# Patient Record
Sex: Female | Born: 1980 | Race: White | Hispanic: No | Marital: Married | Smoking: Current every day smoker
Health system: Southern US, Community
[De-identification: ages and names within clinical notes are randomized; demographics above are authoritative.]

## PROBLEM LIST (undated history)

## (undated) DIAGNOSIS — F319 Bipolar disorder, unspecified: Secondary | ICD-10-CM

## (undated) DIAGNOSIS — T7840XA Allergy, unspecified, initial encounter: Secondary | ICD-10-CM

## (undated) DIAGNOSIS — D3501 Benign neoplasm of right adrenal gland: Secondary | ICD-10-CM

## (undated) DIAGNOSIS — E109 Type 1 diabetes mellitus without complications: Secondary | ICD-10-CM

## (undated) DIAGNOSIS — J449 Chronic obstructive pulmonary disease, unspecified: Secondary | ICD-10-CM

## (undated) DIAGNOSIS — K760 Fatty (change of) liver, not elsewhere classified: Secondary | ICD-10-CM

## (undated) DIAGNOSIS — J45909 Unspecified asthma, uncomplicated: Secondary | ICD-10-CM

## (undated) DIAGNOSIS — K219 Gastro-esophageal reflux disease without esophagitis: Secondary | ICD-10-CM

## (undated) DIAGNOSIS — M1990 Unspecified osteoarthritis, unspecified site: Secondary | ICD-10-CM

## (undated) DIAGNOSIS — F419 Anxiety disorder, unspecified: Secondary | ICD-10-CM

## (undated) DIAGNOSIS — A029 Salmonella infection, unspecified: Secondary | ICD-10-CM

## (undated) HISTORY — DX: Gastro-esophageal reflux disease without esophagitis: K21.9

## (undated) HISTORY — PX: TONSILLECTOMY: SUR1361

## (undated) HISTORY — DX: Unspecified asthma, uncomplicated: J45.909

## (undated) HISTORY — DX: Chronic obstructive pulmonary disease, unspecified: J44.9

## (undated) HISTORY — PX: ABDOMINAL HYSTERECTOMY: SHX81

## (undated) HISTORY — PX: CERVICAL POLYPECTOMY: SHX88

## (undated) HISTORY — DX: Allergy, unspecified, initial encounter: T78.40XA

## (undated) HISTORY — PX: SPINE SURGERY: SHX786

## (undated) HISTORY — DX: Unspecified osteoarthritis, unspecified site: M19.90

## (undated) HISTORY — PX: KNEE ARTHROSCOPY: SUR90

## (undated) HISTORY — PX: BACK SURGERY: SHX140

## (undated) HISTORY — DX: Fatty (change of) liver, not elsewhere classified: K76.0

## (undated) HISTORY — DX: Bipolar disorder, unspecified: F31.9

## (undated) HISTORY — DX: Benign neoplasm of right adrenal gland: D35.01

## (undated) HISTORY — DX: Salmonella infection, unspecified: A02.9

## (undated) HISTORY — DX: Anxiety disorder, unspecified: F41.9

## (undated) HISTORY — DX: Type 1 diabetes mellitus without complications: E10.9

---
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
## 1998-07-24 ENCOUNTER — Inpatient Hospital Stay (HOSPITAL_COMMUNITY): Admission: EM | Admit: 1998-07-24 | Discharge: 1998-07-25 | Payer: Self-pay | Admitting: Family Medicine

## 1998-07-24 ENCOUNTER — Encounter: Payer: Self-pay | Admitting: Family Medicine

## 1998-07-31 ENCOUNTER — Inpatient Hospital Stay (HOSPITAL_COMMUNITY): Admission: EM | Admit: 1998-07-31 | Discharge: 1998-08-03 | Payer: Self-pay

## 1998-09-11 ENCOUNTER — Encounter: Admission: RE | Admit: 1998-09-11 | Discharge: 1998-12-10 | Payer: Self-pay | Admitting: *Deleted

## 1999-02-17 ENCOUNTER — Emergency Department (HOSPITAL_COMMUNITY): Admission: EM | Admit: 1999-02-17 | Discharge: 1999-02-17 | Payer: Self-pay | Admitting: Emergency Medicine

## 1999-03-26 ENCOUNTER — Ambulatory Visit (HOSPITAL_COMMUNITY): Admission: RE | Admit: 1999-03-26 | Discharge: 1999-03-26 | Payer: Self-pay | Admitting: Obstetrics and Gynecology

## 1999-09-11 ENCOUNTER — Emergency Department (HOSPITAL_COMMUNITY): Admission: EM | Admit: 1999-09-11 | Discharge: 1999-09-11 | Payer: Self-pay | Admitting: Emergency Medicine

## 1999-09-11 ENCOUNTER — Encounter: Payer: Self-pay | Admitting: Emergency Medicine

## 1999-09-13 ENCOUNTER — Inpatient Hospital Stay (HOSPITAL_COMMUNITY): Admission: EM | Admit: 1999-09-13 | Discharge: 1999-09-15 | Payer: Self-pay | Admitting: Specialist

## 1999-09-13 ENCOUNTER — Encounter: Payer: Self-pay | Admitting: Specialist

## 1999-09-14 ENCOUNTER — Encounter: Payer: Self-pay | Admitting: Specialist

## 1999-10-24 ENCOUNTER — Other Ambulatory Visit: Admission: RE | Admit: 1999-10-24 | Discharge: 1999-10-24 | Payer: Self-pay | Admitting: Gynecology

## 1999-10-26 ENCOUNTER — Observation Stay (HOSPITAL_COMMUNITY): Admission: EM | Admit: 1999-10-26 | Discharge: 1999-10-27 | Payer: Self-pay | Admitting: Internal Medicine

## 1999-10-26 ENCOUNTER — Encounter: Payer: Self-pay | Admitting: Internal Medicine

## 2000-03-09 ENCOUNTER — Encounter: Payer: Self-pay | Admitting: Specialist

## 2000-03-09 ENCOUNTER — Observation Stay (HOSPITAL_COMMUNITY): Admission: RE | Admit: 2000-03-09 | Discharge: 2000-03-10 | Payer: Self-pay | Admitting: Specialist

## 2000-03-09 ENCOUNTER — Encounter (INDEPENDENT_AMBULATORY_CARE_PROVIDER_SITE_OTHER): Payer: Self-pay

## 2000-03-23 ENCOUNTER — Encounter: Admission: RE | Admit: 2000-03-23 | Discharge: 2000-04-22 | Payer: Self-pay | Admitting: Specialist

## 2000-08-10 ENCOUNTER — Inpatient Hospital Stay (HOSPITAL_COMMUNITY): Admission: EM | Admit: 2000-08-10 | Discharge: 2000-08-21 | Payer: Self-pay | Admitting: Specialist

## 2000-08-10 ENCOUNTER — Encounter (INDEPENDENT_AMBULATORY_CARE_PROVIDER_SITE_OTHER): Payer: Self-pay | Admitting: Specialist

## 2000-08-11 ENCOUNTER — Encounter: Payer: Self-pay | Admitting: Obstetrics and Gynecology

## 2000-08-12 ENCOUNTER — Encounter: Payer: Self-pay | Admitting: Specialist

## 2000-09-17 ENCOUNTER — Encounter: Admission: RE | Admit: 2000-09-17 | Discharge: 2000-09-17 | Payer: Self-pay | Admitting: Family Medicine

## 2000-09-18 ENCOUNTER — Encounter (HOSPITAL_COMMUNITY): Admission: RE | Admit: 2000-09-18 | Discharge: 2000-10-18 | Payer: Self-pay | Admitting: *Deleted

## 2000-09-23 ENCOUNTER — Encounter: Admission: RE | Admit: 2000-09-23 | Discharge: 2000-09-23 | Payer: Self-pay | Admitting: Obstetrics & Gynecology

## 2000-09-28 ENCOUNTER — Inpatient Hospital Stay (HOSPITAL_COMMUNITY): Admission: AD | Admit: 2000-09-28 | Discharge: 2000-09-28 | Payer: Self-pay | Admitting: Obstetrics

## 2000-09-30 ENCOUNTER — Encounter: Admission: RE | Admit: 2000-09-30 | Discharge: 2000-09-30 | Payer: Self-pay | Admitting: Obstetrics & Gynecology

## 2000-10-01 ENCOUNTER — Ambulatory Visit (HOSPITAL_COMMUNITY): Admission: AD | Admit: 2000-10-01 | Discharge: 2000-10-01 | Payer: Self-pay | Admitting: Obstetrics

## 2000-10-03 ENCOUNTER — Inpatient Hospital Stay (HOSPITAL_COMMUNITY): Admission: AD | Admit: 2000-10-03 | Discharge: 2000-10-03 | Payer: Self-pay | Admitting: Obstetrics

## 2000-10-04 ENCOUNTER — Inpatient Hospital Stay (HOSPITAL_COMMUNITY): Admission: AD | Admit: 2000-10-04 | Discharge: 2000-10-04 | Payer: Self-pay | Admitting: *Deleted

## 2000-10-14 ENCOUNTER — Encounter: Admission: RE | Admit: 2000-10-14 | Discharge: 2000-10-14 | Payer: Self-pay | Admitting: Obstetrics & Gynecology

## 2000-10-27 ENCOUNTER — Encounter (HOSPITAL_COMMUNITY): Admission: RE | Admit: 2000-10-27 | Discharge: 2000-11-26 | Payer: Self-pay | Admitting: *Deleted

## 2000-11-03 ENCOUNTER — Inpatient Hospital Stay (HOSPITAL_COMMUNITY): Admission: AD | Admit: 2000-11-03 | Discharge: 2000-11-03 | Payer: Self-pay | Admitting: *Deleted

## 2000-11-06 ENCOUNTER — Encounter: Payer: Self-pay | Admitting: *Deleted

## 2000-11-17 ENCOUNTER — Encounter: Payer: Self-pay | Admitting: *Deleted

## 2000-11-28 ENCOUNTER — Inpatient Hospital Stay (HOSPITAL_COMMUNITY): Admission: AD | Admit: 2000-11-28 | Discharge: 2000-12-01 | Payer: Self-pay | Admitting: *Deleted

## 2000-11-29 ENCOUNTER — Encounter: Payer: Self-pay | Admitting: *Deleted

## 2000-12-01 ENCOUNTER — Inpatient Hospital Stay (HOSPITAL_COMMUNITY): Admission: AD | Admit: 2000-12-01 | Discharge: 2000-12-01 | Payer: Self-pay | Admitting: *Deleted

## 2000-12-08 ENCOUNTER — Encounter (HOSPITAL_COMMUNITY): Admission: RE | Admit: 2000-12-08 | Discharge: 2001-01-07 | Payer: Self-pay | Admitting: *Deleted

## 2000-12-15 ENCOUNTER — Encounter: Payer: Self-pay | Admitting: *Deleted

## 2000-12-18 ENCOUNTER — Encounter: Payer: Self-pay | Admitting: *Deleted

## 2000-12-22 ENCOUNTER — Encounter: Payer: Self-pay | Admitting: *Deleted

## 2000-12-25 ENCOUNTER — Encounter: Payer: Self-pay | Admitting: *Deleted

## 2000-12-25 ENCOUNTER — Inpatient Hospital Stay (HOSPITAL_COMMUNITY): Admission: AD | Admit: 2000-12-25 | Discharge: 2001-01-01 | Payer: Self-pay | Admitting: Obstetrics & Gynecology

## 2000-12-27 ENCOUNTER — Encounter: Payer: Self-pay | Admitting: Obstetrics & Gynecology

## 2000-12-31 ENCOUNTER — Encounter: Payer: Self-pay | Admitting: *Deleted

## 2001-01-05 ENCOUNTER — Encounter: Payer: Self-pay | Admitting: *Deleted

## 2001-01-08 ENCOUNTER — Encounter (HOSPITAL_COMMUNITY): Admission: RE | Admit: 2001-01-08 | Discharge: 2001-01-12 | Payer: Self-pay | Admitting: Obstetrics & Gynecology

## 2001-01-13 ENCOUNTER — Encounter (INDEPENDENT_AMBULATORY_CARE_PROVIDER_SITE_OTHER): Payer: Self-pay | Admitting: Specialist

## 2001-01-13 ENCOUNTER — Inpatient Hospital Stay (HOSPITAL_COMMUNITY): Admission: AD | Admit: 2001-01-13 | Discharge: 2001-01-19 | Payer: Self-pay | Admitting: Obstetrics

## 2001-01-23 ENCOUNTER — Inpatient Hospital Stay (HOSPITAL_COMMUNITY): Admission: AD | Admit: 2001-01-23 | Discharge: 2001-01-23 | Payer: Self-pay | Admitting: Obstetrics

## 2001-03-09 ENCOUNTER — Inpatient Hospital Stay (HOSPITAL_COMMUNITY): Admission: AD | Admit: 2001-03-09 | Discharge: 2001-03-09 | Payer: Self-pay | Admitting: *Deleted

## 2001-03-09 ENCOUNTER — Encounter (INDEPENDENT_AMBULATORY_CARE_PROVIDER_SITE_OTHER): Payer: Self-pay | Admitting: Specialist

## 2001-06-04 ENCOUNTER — Encounter: Payer: Self-pay | Admitting: Specialist

## 2001-06-09 ENCOUNTER — Inpatient Hospital Stay (HOSPITAL_COMMUNITY): Admission: RE | Admit: 2001-06-09 | Discharge: 2001-06-14 | Payer: Self-pay | Admitting: Specialist

## 2001-06-09 ENCOUNTER — Encounter: Payer: Self-pay | Admitting: Specialist

## 2001-08-10 ENCOUNTER — Encounter: Admission: RE | Admit: 2001-08-10 | Discharge: 2001-11-08 | Payer: Self-pay

## 2001-09-10 ENCOUNTER — Encounter: Payer: Self-pay | Admitting: Family Medicine

## 2001-09-10 ENCOUNTER — Encounter: Admission: RE | Admit: 2001-09-10 | Discharge: 2001-09-10 | Payer: Self-pay | Admitting: Family Medicine

## 2001-11-10 ENCOUNTER — Other Ambulatory Visit: Admission: RE | Admit: 2001-11-10 | Discharge: 2001-11-10 | Payer: Self-pay | Admitting: Obstetrics and Gynecology

## 2002-10-14 ENCOUNTER — Encounter: Payer: Self-pay | Admitting: Family Medicine

## 2002-10-14 ENCOUNTER — Encounter: Admission: RE | Admit: 2002-10-14 | Discharge: 2002-10-14 | Payer: Self-pay | Admitting: Family Medicine

## 2002-12-15 ENCOUNTER — Emergency Department (HOSPITAL_COMMUNITY): Admission: EM | Admit: 2002-12-15 | Discharge: 2002-12-15 | Payer: Self-pay | Admitting: Emergency Medicine

## 2002-12-15 ENCOUNTER — Encounter: Payer: Self-pay | Admitting: Specialist

## 2002-12-23 ENCOUNTER — Emergency Department (HOSPITAL_COMMUNITY): Admission: EM | Admit: 2002-12-23 | Discharge: 2002-12-23 | Payer: Self-pay | Admitting: Emergency Medicine

## 2002-12-23 ENCOUNTER — Encounter: Payer: Self-pay | Admitting: Emergency Medicine

## 2002-12-28 ENCOUNTER — Other Ambulatory Visit: Admission: RE | Admit: 2002-12-28 | Discharge: 2002-12-28 | Payer: Self-pay | Admitting: Obstetrics and Gynecology

## 2003-04-27 ENCOUNTER — Ambulatory Visit (HOSPITAL_COMMUNITY): Admission: RE | Admit: 2003-04-27 | Discharge: 2003-04-27 | Payer: Self-pay | Admitting: *Deleted

## 2003-09-08 ENCOUNTER — Emergency Department (HOSPITAL_COMMUNITY): Admission: EM | Admit: 2003-09-08 | Discharge: 2003-09-09 | Payer: Self-pay | Admitting: Emergency Medicine

## 2004-03-04 ENCOUNTER — Ambulatory Visit: Payer: Self-pay | Admitting: Endocrinology

## 2004-03-14 ENCOUNTER — Ambulatory Visit: Payer: Self-pay | Admitting: Endocrinology

## 2004-03-18 ENCOUNTER — Other Ambulatory Visit: Admission: RE | Admit: 2004-03-18 | Discharge: 2004-03-18 | Payer: Self-pay | Admitting: Obstetrics and Gynecology

## 2004-05-17 ENCOUNTER — Ambulatory Visit: Payer: Self-pay | Admitting: Endocrinology

## 2004-08-06 ENCOUNTER — Ambulatory Visit: Payer: Self-pay | Admitting: Endocrinology

## 2004-09-04 ENCOUNTER — Ambulatory Visit: Payer: Self-pay | Admitting: Endocrinology

## 2004-10-17 ENCOUNTER — Ambulatory Visit: Payer: Self-pay | Admitting: Endocrinology

## 2004-12-02 ENCOUNTER — Ambulatory Visit: Payer: Self-pay | Admitting: Internal Medicine

## 2004-12-11 ENCOUNTER — Ambulatory Visit: Payer: Self-pay | Admitting: Endocrinology

## 2004-12-16 ENCOUNTER — Ambulatory Visit: Payer: Self-pay | Admitting: Internal Medicine

## 2005-01-08 ENCOUNTER — Ambulatory Visit: Payer: Self-pay | Admitting: Endocrinology

## 2005-01-20 ENCOUNTER — Ambulatory Visit: Payer: Self-pay | Admitting: Endocrinology

## 2005-01-22 ENCOUNTER — Ambulatory Visit: Payer: Self-pay | Admitting: Endocrinology

## 2005-03-18 ENCOUNTER — Other Ambulatory Visit: Admission: RE | Admit: 2005-03-18 | Discharge: 2005-03-18 | Payer: Self-pay | Admitting: Obstetrics and Gynecology

## 2005-04-10 ENCOUNTER — Ambulatory Visit: Payer: Self-pay | Admitting: Internal Medicine

## 2005-04-16 ENCOUNTER — Ambulatory Visit: Payer: Self-pay | Admitting: Internal Medicine

## 2005-05-08 ENCOUNTER — Ambulatory Visit: Payer: Self-pay | Admitting: Endocrinology

## 2005-05-12 ENCOUNTER — Ambulatory Visit: Payer: Self-pay | Admitting: Endocrinology

## 2005-05-16 ENCOUNTER — Ambulatory Visit: Payer: Self-pay | Admitting: Endocrinology

## 2005-05-20 ENCOUNTER — Ambulatory Visit: Payer: Self-pay | Admitting: *Deleted

## 2005-05-22 ENCOUNTER — Ambulatory Visit: Payer: Self-pay | Admitting: Endocrinology

## 2005-06-25 ENCOUNTER — Ambulatory Visit: Payer: Self-pay | Admitting: Internal Medicine

## 2005-07-24 ENCOUNTER — Ambulatory Visit: Payer: Self-pay | Admitting: Endocrinology

## 2005-07-24 ENCOUNTER — Ambulatory Visit (HOSPITAL_COMMUNITY): Admission: RE | Admit: 2005-07-24 | Discharge: 2005-07-24 | Payer: Self-pay | Admitting: Internal Medicine

## 2005-08-06 ENCOUNTER — Ambulatory Visit: Payer: Self-pay | Admitting: Endocrinology

## 2005-10-24 ENCOUNTER — Emergency Department (HOSPITAL_COMMUNITY): Admission: EM | Admit: 2005-10-24 | Discharge: 2005-10-25 | Payer: Self-pay | Admitting: Emergency Medicine

## 2005-10-27 ENCOUNTER — Ambulatory Visit: Payer: Self-pay | Admitting: Endocrinology

## 2005-12-08 ENCOUNTER — Ambulatory Visit: Payer: Self-pay | Admitting: Internal Medicine

## 2005-12-10 ENCOUNTER — Ambulatory Visit: Payer: Self-pay | Admitting: Gastroenterology

## 2005-12-11 ENCOUNTER — Ambulatory Visit (HOSPITAL_COMMUNITY): Admission: RE | Admit: 2005-12-11 | Discharge: 2005-12-11 | Payer: Self-pay | Admitting: Gastroenterology

## 2005-12-11 ENCOUNTER — Encounter (INDEPENDENT_AMBULATORY_CARE_PROVIDER_SITE_OTHER): Payer: Self-pay | Admitting: Specialist

## 2005-12-11 ENCOUNTER — Encounter: Payer: Self-pay | Admitting: Gastroenterology

## 2005-12-17 ENCOUNTER — Ambulatory Visit: Payer: Self-pay | Admitting: Gastroenterology

## 2006-02-02 ENCOUNTER — Ambulatory Visit: Payer: Self-pay | Admitting: Endocrinology

## 2006-03-24 ENCOUNTER — Other Ambulatory Visit: Admission: RE | Admit: 2006-03-24 | Discharge: 2006-03-24 | Payer: Self-pay | Admitting: Obstetrics and Gynecology

## 2006-04-01 ENCOUNTER — Ambulatory Visit: Payer: Self-pay | Admitting: Internal Medicine

## 2006-04-01 ENCOUNTER — Ambulatory Visit: Payer: Self-pay | Admitting: Endocrinology

## 2006-04-01 LAB — CONVERTED CEMR LAB
ALT: 11 units/L (ref 0–40)
AST: 15 units/L (ref 0–37)
Albumin: 3.7 g/dL (ref 3.5–5.2)
Alkaline Phosphatase: 71 units/L (ref 39–117)
BUN: 11 mg/dL (ref 6–23)
Basophils Absolute: 0 10*3/uL (ref 0.0–0.1)
Basophils Relative: 0.2 % (ref 0.0–1.0)
CO2: 30 meq/L (ref 19–32)
Calcium: 9.2 mg/dL (ref 8.4–10.5)
Chloride: 98 meq/L (ref 96–112)
Chol/HDL Ratio, serum: 5
Cholesterol: 213 mg/dL (ref 0–200)
Creatinine, Ser: 0.8 mg/dL (ref 0.4–1.2)
Eosinophil percent: 1.2 % (ref 0.0–5.0)
GFR calc non Af Amer: 91 mL/min
Glomerular Filtration Rate, Af Am: 111 mL/min/{1.73_m2}
Glucose, Bld: 245 mg/dL — ABNORMAL HIGH (ref 70–99)
HCT: 43.5 % (ref 36.0–46.0)
HDL: 42.3 mg/dL (ref >39.0)
Hemoglobin: 14.5 g/dL (ref 12.0–15.0)
Hgb A1c MFr Bld: 8.7 % — ABNORMAL HIGH (ref 4.6–6.0)
LDL DIRECT: 135.9 mg/dL
Lymphocytes Relative: 43.4 % (ref 12.0–46.0)
MCHC: 33.3 g/dL (ref 30.0–36.0)
MCV: 87.8 fL (ref 78.0–100.0)
Monocytes Absolute: 0.5 10*3/uL (ref 0.2–0.7)
Monocytes Relative: 6.1 % (ref 3.0–11.0)
Neutro Abs: 4.4 10*3/uL (ref 1.4–7.7)
Neutrophils Relative %: 49.1 % (ref 43.0–77.0)
Platelets: 373 10*3/uL (ref 150–400)
Potassium: 3.9 meq/L (ref 3.5–5.1)
RBC: 4.95 M/uL (ref 3.87–5.11)
RDW: 12.7 % (ref 11.5–14.6)
Sodium: 137 meq/L (ref 135–145)
Total Bilirubin: 0.4 mg/dL (ref 0.3–1.2)
Total Protein: 6.3 g/dL (ref 6.0–8.3)
Triglyceride fasting, serum: 375 mg/dL (ref 0–149)
VLDL: 75 mg/dL — ABNORMAL HIGH (ref 0–40)
WBC: 8.8 10*3/uL (ref 4.5–10.5)

## 2006-04-17 ENCOUNTER — Encounter: Admission: RE | Admit: 2006-04-17 | Discharge: 2006-04-17 | Payer: Self-pay | Admitting: Internal Medicine

## 2006-04-17 ENCOUNTER — Ambulatory Visit: Payer: Self-pay | Admitting: Internal Medicine

## 2006-05-05 ENCOUNTER — Ambulatory Visit: Payer: Self-pay | Admitting: Endocrinology

## 2006-05-25 ENCOUNTER — Ambulatory Visit: Payer: Self-pay | Admitting: Internal Medicine

## 2006-06-30 ENCOUNTER — Other Ambulatory Visit: Admission: RE | Admit: 2006-06-30 | Discharge: 2006-06-30 | Payer: Self-pay | Admitting: Obstetrics and Gynecology

## 2006-07-28 LAB — CONVERTED CEMR LAB

## 2006-09-25 ENCOUNTER — Encounter: Payer: Self-pay | Admitting: Endocrinology

## 2006-09-25 DIAGNOSIS — F329 Major depressive disorder, single episode, unspecified: Secondary | ICD-10-CM | POA: Insufficient documentation

## 2006-09-25 DIAGNOSIS — K56609 Unspecified intestinal obstruction, unspecified as to partial versus complete obstruction: Secondary | ICD-10-CM | POA: Insufficient documentation

## 2006-09-25 DIAGNOSIS — E109 Type 1 diabetes mellitus without complications: Secondary | ICD-10-CM | POA: Insufficient documentation

## 2006-10-02 ENCOUNTER — Ambulatory Visit: Payer: Self-pay | Admitting: Gastroenterology

## 2006-10-02 LAB — CONVERTED CEMR LAB
ALT: 18 units/L (ref 0–40)
AST: 29 units/L (ref 0–37)
Albumin: 3.3 g/dL — ABNORMAL LOW (ref 3.5–5.2)
Alkaline Phosphatase: 67 units/L (ref 39–117)
BUN: 6 mg/dL (ref 6–23)
Basophils Absolute: 0.2 10*3/uL — ABNORMAL HIGH (ref 0.0–0.1)
Basophils Relative: 1.8 % — ABNORMAL HIGH (ref 0.0–1.0)
Bilirubin, Direct: 0.3 mg/dL (ref 0.0–0.3)
CO2: 22 meq/L (ref 19–32)
Calcium: 9.3 mg/dL (ref 8.4–10.5)
Chloride: 103 meq/L (ref 96–112)
Creatinine, Ser: 0.6 mg/dL (ref 0.4–1.2)
Eosinophils Absolute: 0.1 10*3/uL (ref 0.0–0.6)
Eosinophils Relative: 0.7 % (ref 0.0–5.0)
GFR calc Af Amer: 154 mL/min
GFR calc non Af Amer: 127 mL/min
Glucose, Bld: 116 mg/dL — ABNORMAL HIGH (ref 70–99)
HCT: 42.6 % (ref 36.0–46.0)
Hemoglobin: 14.3 g/dL (ref 12.0–15.0)
Lymphocytes Relative: 29.2 % (ref 12.0–46.0)
MCHC: 33.6 g/dL (ref 30.0–36.0)
MCV: 85.9 fL (ref 78.0–100.0)
Monocytes Absolute: 0.7 10*3/uL (ref 0.2–0.7)
Monocytes Relative: 6.2 % (ref 3.0–11.0)
Neutro Abs: 6.6 10*3/uL (ref 1.4–7.7)
Neutrophils Relative %: 62.1 % (ref 43.0–77.0)
Platelets: 340 10*3/uL (ref 150–400)
Potassium: 4.8 meq/L (ref 3.5–5.1)
RBC: 4.96 M/uL (ref 3.87–5.11)
RDW: 12.3 % (ref 11.5–14.6)
Sodium: 138 meq/L (ref 135–145)
Total Bilirubin: 0.9 mg/dL (ref 0.3–1.2)
Total Protein: 6.6 g/dL (ref 6.0–8.3)
WBC: 10.8 10*3/uL — ABNORMAL HIGH (ref 4.5–10.5)

## 2006-11-09 ENCOUNTER — Ambulatory Visit: Payer: Self-pay | Admitting: Family Medicine

## 2006-11-09 DIAGNOSIS — F3189 Other bipolar disorder: Secondary | ICD-10-CM | POA: Insufficient documentation

## 2006-11-13 ENCOUNTER — Ambulatory Visit: Payer: Self-pay | Admitting: Family Medicine

## 2006-11-13 DIAGNOSIS — J209 Acute bronchitis, unspecified: Secondary | ICD-10-CM | POA: Insufficient documentation

## 2006-12-25 ENCOUNTER — Ambulatory Visit: Payer: Self-pay | Admitting: Family Medicine

## 2007-01-19 ENCOUNTER — Encounter: Admission: RE | Admit: 2007-01-19 | Discharge: 2007-01-19 | Payer: Self-pay | Admitting: Family Medicine

## 2007-01-19 ENCOUNTER — Ambulatory Visit: Payer: Self-pay | Admitting: Family Medicine

## 2007-01-19 DIAGNOSIS — R079 Chest pain, unspecified: Secondary | ICD-10-CM | POA: Insufficient documentation

## 2007-01-19 LAB — CONVERTED CEMR LAB
Band Neutrophils: 5 % (ref 0–10)
Basophils Absolute: 0 10*3/uL (ref 0.0–0.1)
Basophils Relative: 0 % (ref 0–1)
Beta hcg, urine, semiquantitative: NEGATIVE
Bilirubin Urine: NEGATIVE
Eosinophils Absolute: 0 10*3/uL (ref 0.0–0.7)
Eosinophils Relative: 0 % (ref 0–5)
Glucose, Bld: 164 mg/dL
Glucose, Urine, Semiquant: NEGATIVE
HCT: 46.7 % — ABNORMAL HIGH (ref 36.0–46.0)
Hemoglobin: 16.1 g/dL — ABNORMAL HIGH (ref 12.0–15.0)
Lymphocytes Relative: 13 % (ref 12–46)
Lymphs Abs: 1 10*3/uL (ref 0.7–3.3)
MCHC: 34.4 g/dL (ref 30.0–36.0)
MCV: 87.1 fL (ref 78.0–100.0)
Monocytes Absolute: 0.5 10*3/uL (ref 0.2–0.7)
Monocytes Relative: 6 % (ref 3–11)
Neutro Abs: 6.2 10*3/uL (ref 1.7–7.7)
Neutrophils Relative %: 76 % (ref 43–77)
Nitrite: NEGATIVE
Platelets: 291 10*3/uL (ref 150–400)
Protein, U semiquant: 100
RBC: 5.37 M/uL — ABNORMAL HIGH (ref 3.87–5.11)
RDW: 13.4 % (ref 11.5–14.0)
Specific Gravity, Urine: 1.02
Urobilinogen, UA: 0.2
WBC Urine, dipstick: NEGATIVE
WBC: 7.6 10*3/uL (ref 4.0–10.5)
pH: 8

## 2007-01-20 ENCOUNTER — Telehealth: Payer: Self-pay | Admitting: Family Medicine

## 2007-01-20 ENCOUNTER — Encounter: Payer: Self-pay | Admitting: Family Medicine

## 2007-01-20 LAB — CONVERTED CEMR LAB
ALT: 10 units/L (ref 0–35)
AST: 15 units/L (ref 0–37)
Albumin: 4.4 g/dL (ref 3.5–5.2)
Alkaline Phosphatase: 68 units/L (ref 39–117)
BUN: 11 mg/dL (ref 6–23)
CO2: 23 meq/L (ref 19–32)
Calcium: 9.1 mg/dL (ref 8.4–10.5)
Chloride: 102 meq/L (ref 96–112)
Creatinine, Ser: 0.76 mg/dL (ref 0.40–1.20)
Glucose, Bld: 139 mg/dL — ABNORMAL HIGH (ref 70–99)
Potassium: 4.3 meq/L (ref 3.5–5.3)
Sodium: 137 meq/L (ref 135–145)
Total Bilirubin: 0.3 mg/dL (ref 0.3–1.2)
Total Protein: 6.8 g/dL (ref 6.0–8.3)

## 2007-01-21 LAB — CONVERTED CEMR LAB
Chlamydia, Swab/Urine, PCR: NEGATIVE
GC Probe Amp, Urine: NEGATIVE

## 2007-02-11 ENCOUNTER — Other Ambulatory Visit: Admission: RE | Admit: 2007-02-11 | Discharge: 2007-02-11 | Payer: Self-pay | Admitting: Obstetrics and Gynecology

## 2007-02-15 ENCOUNTER — Encounter (INDEPENDENT_AMBULATORY_CARE_PROVIDER_SITE_OTHER): Payer: Self-pay | Admitting: *Deleted

## 2007-03-01 ENCOUNTER — Ambulatory Visit: Payer: Self-pay | Admitting: Family Medicine

## 2007-03-01 DIAGNOSIS — J45902 Unspecified asthma with status asthmaticus: Secondary | ICD-10-CM | POA: Insufficient documentation

## 2007-03-11 ENCOUNTER — Ambulatory Visit: Payer: Self-pay | Admitting: Family Medicine

## 2007-03-30 ENCOUNTER — Other Ambulatory Visit: Admission: RE | Admit: 2007-03-30 | Discharge: 2007-03-30 | Payer: Self-pay | Admitting: Obstetrics and Gynecology

## 2007-03-31 ENCOUNTER — Encounter: Admission: RE | Admit: 2007-03-31 | Discharge: 2007-03-31 | Payer: Self-pay | Admitting: Family Medicine

## 2007-06-11 ENCOUNTER — Encounter: Payer: Self-pay | Admitting: Endocrinology

## 2007-09-11 DIAGNOSIS — K2990 Gastroduodenitis, unspecified, without bleeding: Secondary | ICD-10-CM

## 2007-09-11 DIAGNOSIS — N83209 Unspecified ovarian cyst, unspecified side: Secondary | ICD-10-CM | POA: Insufficient documentation

## 2007-09-11 DIAGNOSIS — K2970 Gastritis, unspecified, without bleeding: Secondary | ICD-10-CM | POA: Insufficient documentation

## 2007-09-16 ENCOUNTER — Encounter: Payer: Self-pay | Admitting: Endocrinology

## 2007-09-28 ENCOUNTER — Encounter: Payer: Self-pay | Admitting: Family Medicine

## 2008-06-02 IMAGING — CT CT PELVIS W/O CM
2 of 4 series · 17 of 46 positions shown, 19 images · non-contrast
Comparison: None.

CLINICAL DATA: Hematuria with left flank pain.
 ABDOMEN CT WITHOUT CONTRAST ? 10/25/05:
TECHNIQUE: Multidetector CT imaging of the abdomen was performed following the standard protocol without IV contrast.
TECHNIQUE: Multidetector CT imaging of the pelvis was performed following the standard protocol without IV contrast.

[Series 2: stone_wo 5.0 b40f st · axial · 0.61mm/px · z∈[-436,-68]mm · 14 of 101 slices shown, 16 images]
[im 5/101  soft-tissue]
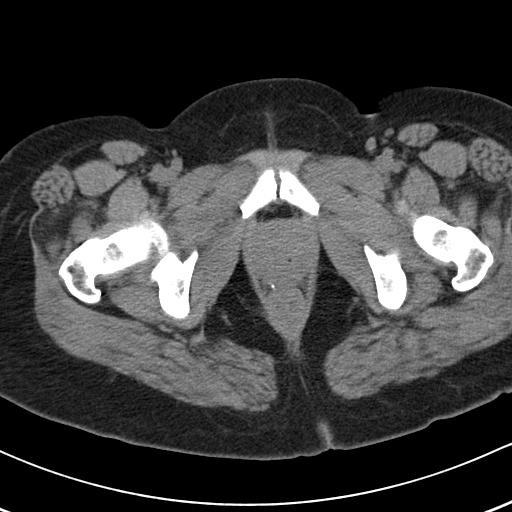
[im 5/101  bone]
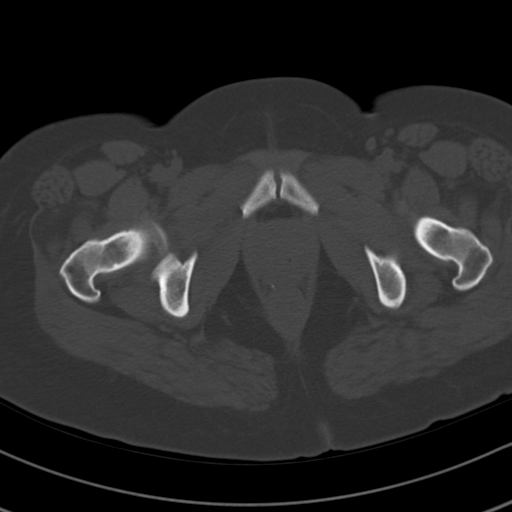
[im 13/101  soft-tissue]
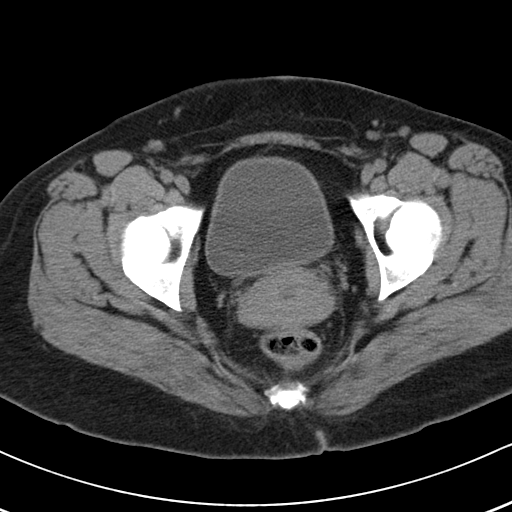
[im 21/101  soft-tissue]
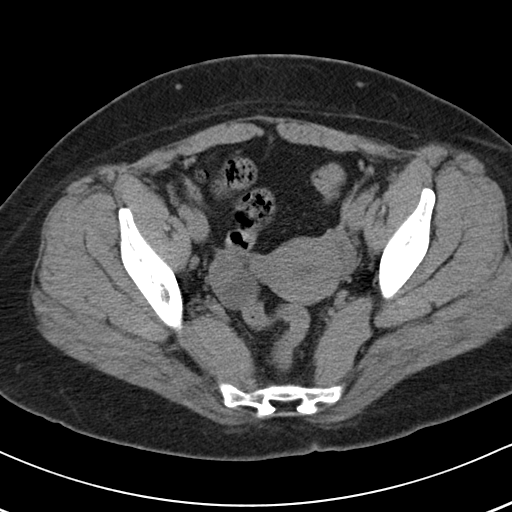
[im 29/101  soft-tissue]
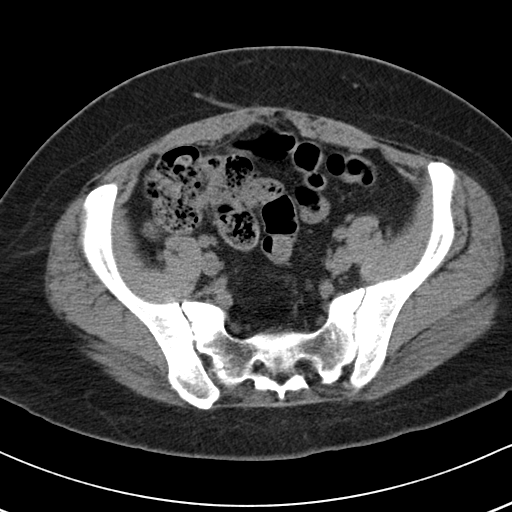
[im 33/101  soft-tissue]
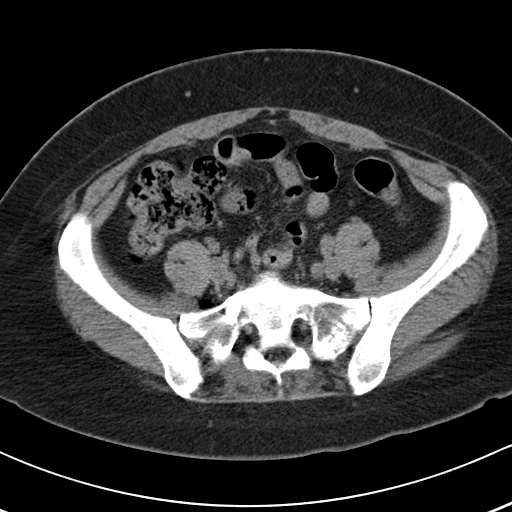
[im 41/101  soft-tissue]
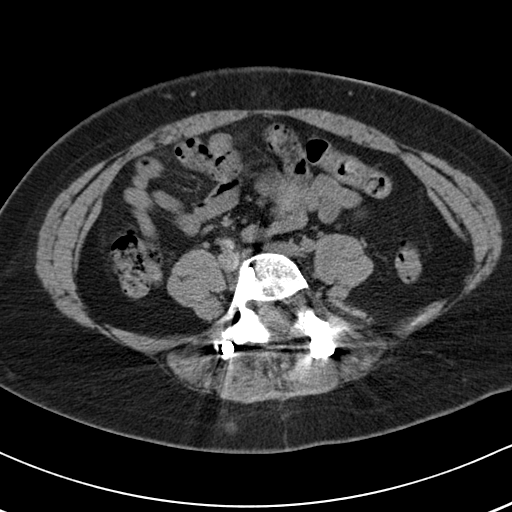
[im 49/101  soft-tissue]
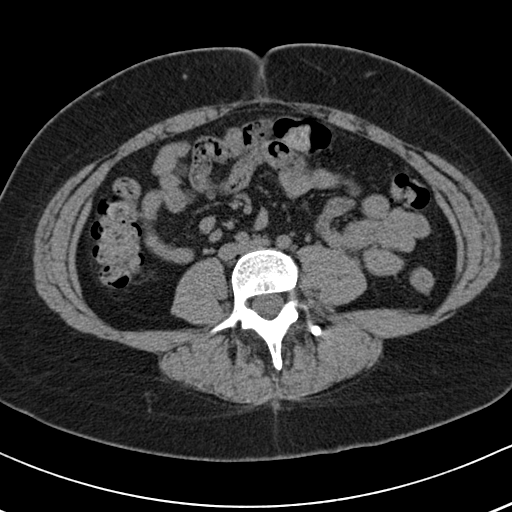
[im 53/101  soft-tissue]
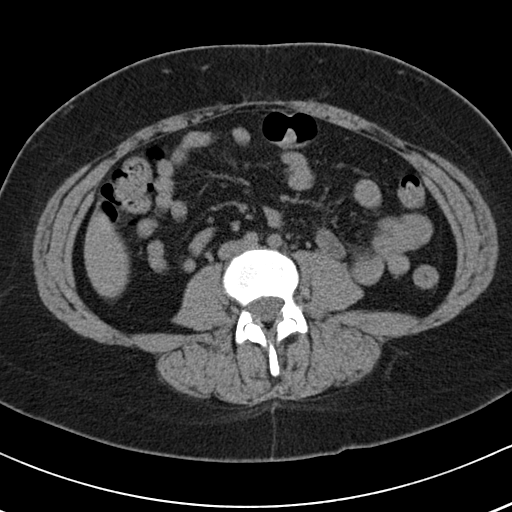
[im 61/101  soft-tissue]
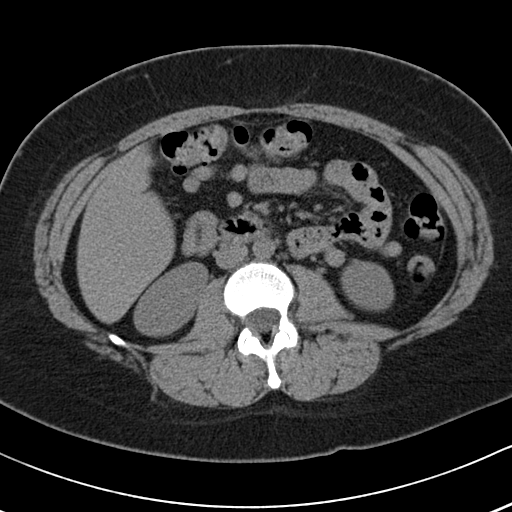
[im 61/101  bone]
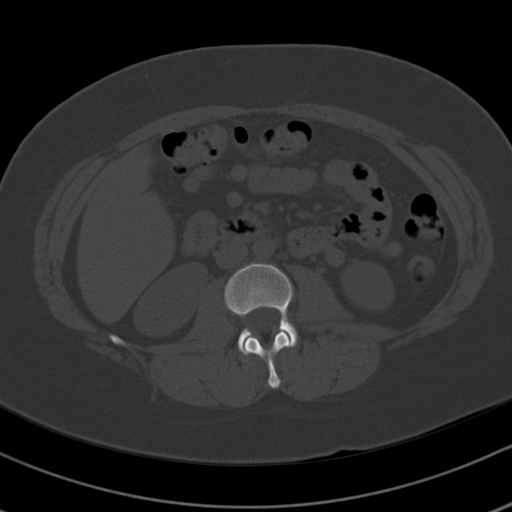
[im 69/101  soft-tissue]
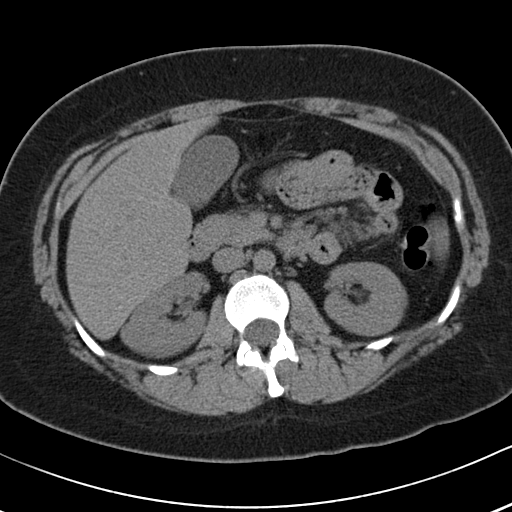
[im 77/101  soft-tissue]
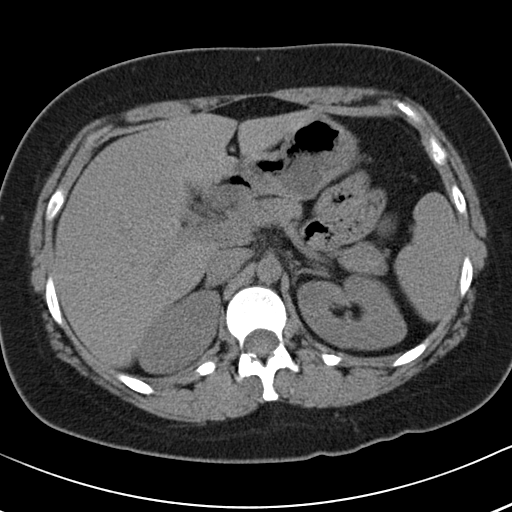
[im 81/101  soft-tissue]
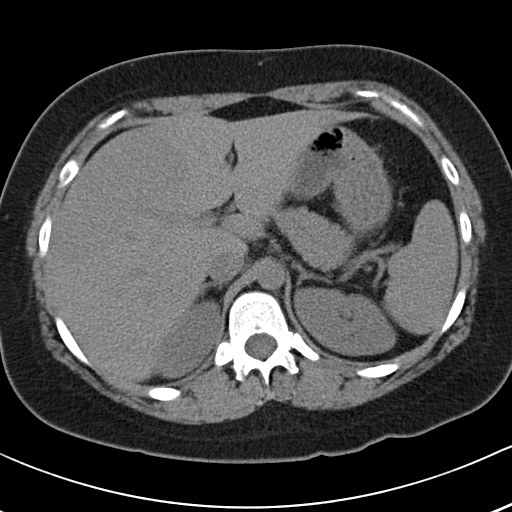
[im 89/101  soft-tissue]
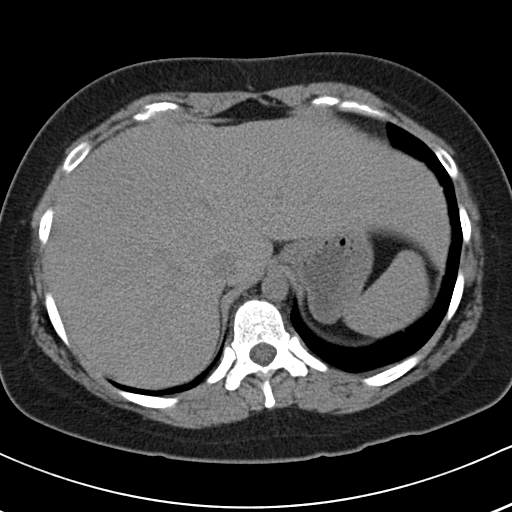
[im 97/101  soft-tissue]
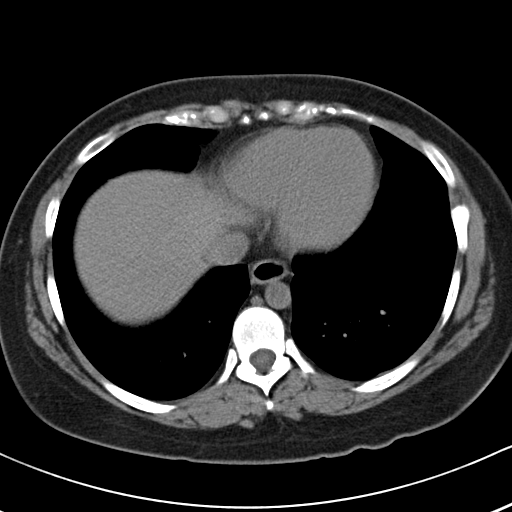

[Series 602: coronal abdomen · coronal · 0.82mm/px · 3 of 119 slices shown]
[im 40/119  soft-tissue]
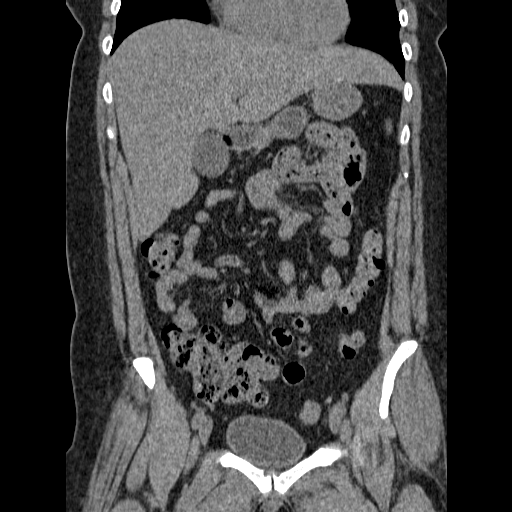
[im 53/119  soft-tissue]
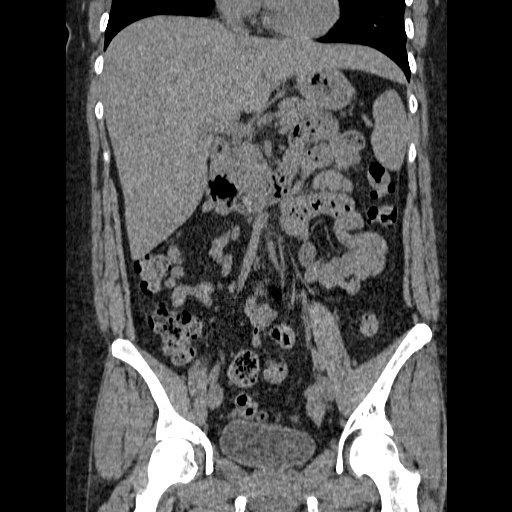
[im 66/119  soft-tissue]
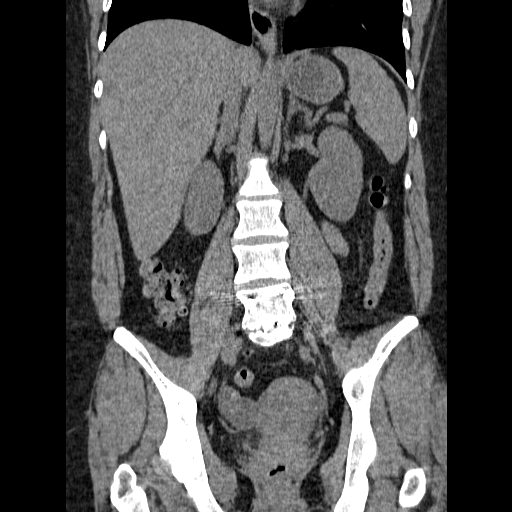

[17 of 46 positions shown; findings below may reference images not displayed]

FINDINGS: A nonacute lower left rib fracture is noted.  The liver and spleen have normal uninfused features.  The stomach, duodenum, pancreas, gallbladder, and adrenal glands are unremarkable. 
 No renal calculi.  No hydronephrosis.  No secondary changes in either kidney.  Persistent fetal lobation is noted, right greater than left.
IMPRESSION: No acute findings in the abdomen.
 PELVIS CT WITHOUT CONTRAST ? 10/25/05:
FINDINGS: No ureteral or bladder calculi.  No left adnexal cyst or mass.  A 2.6 cm cyst is identified in the right ovary.  
 The appendix is normal.  The terminal ileum is unremarkable.  There is no evidence for lymphadenopathy or intraperitoneal free fluid.  
 The patient is status post fusion at the lumbosacral junction.  There is sclerotic change at both sacroiliac joints.  A subcentimeter nodular density in the subcutaneous fat of the lower back, at the level [DATE] be related to scarring.
IMPRESSION: 1.  No distal urinary stones.  
 2.  2.6 cm right ovarian cyst.  Follow-up ultrasound is recommended to further evaluate and ensure resolution.

## 2008-09-08 ENCOUNTER — Ambulatory Visit: Payer: Self-pay | Admitting: Endocrinology

## 2008-09-08 LAB — CONVERTED CEMR LAB: Hgb A1c MFr Bld: 8.7 % — ABNORMAL HIGH (ref 4.6–6.5)

## 2010-09-10 NOTE — Assessment & Plan Note (Signed)
[facility]                         GASTROENTEROLOGY OFFICE NOTE   NAME:[name], Wendy L                        MRN:          [id number]  DATE:10/02/2006                            DOB:          Sep 10, 1980    GI PROBLEM LIST:  Gastritis.  Seen on EGD in August of 2007.  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  Was causing severe pains that have improved since  stopping nonsteroidal anti-inflammatory drugs and proton pump inhibitor.  Biopsies did not show any acute inflammaion.  No H. pylori.  CT Scan in  June 2007 without IV contrast essentially normal.   INTERVAL HISTORY:  I last saw Wendy [name] about a year ago.  At that time, she  was having trouble with gastritis, likely from Motrin that she was  taking.  She was due to follow up with me 6 weeks after that, but  canceled appointment, has not shown up, and I have not heard from her  since.  She now comes in with approximately a 72-month history of  multiple GI complaints.  She has had rather constant nausea.  She has a  pain in her midepigastrium, she also has a sense of urgency to move her  bowels with a pain when the bowel movement actually comes.  She has seen  no bleeding.  She has no diarrhea.  It seems like her bowel movements  are soft and well formed.  She does not have to strain.  She has had no  fevers or chills.  Interestingly, she started Lexapro 3-4 months ago and  the number 1 side effect of that is nausea.  Diarrhea and constipation  are also on the list, but those are more uncommon.   CURRENT MEDICATIONS:  1. Lamictal.  2. Lunesta.  3. Insulin.  4. Neurontin.  5. Hydrocodone.  6. Lexapro.   PHYSICAL EXAMINATION:  Weight 173 pounds which is up 9 pounds in 1 year.  Blood pressure 114/60, pulse 78.  LUNGS:  Clear to auscultation bilaterally.  HEART:  Regular rate and rhythm.  ABDOMEN:  Soft.  Mildly tender in the mid epigastrium.  No peritoneal  signs, nondistended.  Normal bowel sounds.   ASSESSMENT  AND PLAN:  A 30 year old woman with new constellation of  gastrointestinal symptoms including abdominal pain, nausea, urgency, and  anal discomfort.   It is not clear if all of these symptoms are from the same underlying  etiology.  The number one side effect of Lexapro is nausea, and that is  probably, at least, contributing to her nausea complaint.  The thing I  am most concerned about is her sense of urgency and some anal  discomfort.  She has seen no bleeding, but I think we should proceed  with a flexible sigmoidoscopy to evaluate.  She will also get a base set  of labs including a CBC and a complete metabolic profile.  I will start  her on fiber supplementations with Citrucel for now.  If there are no  findings on sigmoidoscopy, would have to consider further imaging  studies, possibly CT scan.  She did have an  EGD about a year ago that showed gastritis.  We may need  to repeat that, but it will depend on the workup prior to that.     Wendy Fee, MD  Electronically Signed    DPJ/MedQ  DD: 10/02/2006  DT: 10/02/2006  Job #: [id number]   cc:   Wendy Signs A. [name] All, MD

## 2010-09-13 NOTE — H&P (Signed)
[place]  Patient:    Wendy [name], BOREL Visit Number: [id number] MRN: [id number]          Service Type: MED Location: [facility] Attending Physician:  Michaelle Copas Dictated by:   Alexzandrew L. Perkins, P.A.-C. Admit Date:  03/09/2001 Discharge Date: 03/09/2001   CC:         Birdena Jubilee, M.D., [facility]   History and Physical  DATE OF OFFICE VISIT: May 31, 2001  CHIEF COMPLAINT: Continued back pain.  HISTORY OF PRESENT ILLNESS: The patient is a 30 year old female, who is well known to Dr. Rosana Fret practice, having previously undergone three back surgeries in the past.  She has had multiple disk problems.  Her symptoms date back around May 2001 when she sustained a work-related injury.  She had progressive neurologic deficit and underwent a left L5-S1 hemilaminotomy and diskectomy at that time.  She had a recurrence back in November 2001 and underwent revision of the L5-S1 microdiskectomy and then, unfortunately, developed some recurrence of radicular pain back in April 2002 when she was four months pregnancy.  She underwent a third decompression with only partial improvement of her symptoms.  She has had continued pain.  She has been seen in consultation by Dr. Sharolyn Douglas for a second opinion, and was referred back to Dr. Shelle Iron, with the recommendation of revision of the L5-S1 laminectomy and decompression of the nerve roots, and it was also recommended that she undergo a fusion at the L5-S1 disk space.  The recommendations have been reviewed and discussed with the patient at length and she has elected to proceed with surgery.  The patient has been seen by Dr. family physician, Dr. Alberteen Sam, and has been cleared for the up and coming surgery.  Risks and benefits were discussed and she will proceed with surgery on June 09, 2001.  ALLERGIES:  1. ROBAXIN causes hives.  2. BEXTRA causes hives.  CURRENT  MEDICATIONS:  1. Lantus insulin 15 units at night.  2. Percocet p.r.n. pain.  3. Neurontin.  4. Skelaxin.  5. Altace, recently increased to 5 mg one p.o. q.d.  6. Transdermal Ortho Evra birth control patch weekly.  She will stop this     the weekend prior to her surgery.  PAST MEDICAL HISTORY:  1. Depression.  2. Posttraumatic stress disorder, history of Lithium treatment.  3. Hypertension.  4. Type 2 diabetes mellitus.  5. Degenerative disk disease.  6. Irritable bowel syndrome.  7. Genital herpes.  8. History of urinary tract infection.  9. History of multiple herniated disks.  PAST SURGICAL HISTORY:  1. Cesarean section September 2002.  2. Back surgery x3.  FAMILY HISTORY: The patient is adopted.  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  SOCIAL HISTORY: The patient is married, has an infant son.  Denies use of tobacco products at this time.  No alcohol products.  Caregiver after surgery will be her husband.  REVIEW OF SYSTEMS: GENERAL: No fever, chills, night sweats.  NEUROLOGIC: No seizures, syncope, paralysis.  RESPIRATORY: No shortness of breath, productive cough or hemoptysis.  CARDIOVASCULAR: No chest pain, angina, or orthopnea. GI: The patient has had some intermittent nausea in the past; however, this has resolved.  No diarrhea or constipation.  No blood or mucus in the stool. GU: She does have some urinary frequency and also nocturia, which she relates to her diabetes.  No dysuria or hematuria.  ENDOCRINE: She is a known diabetic, with documented hemoglobin  A1C in December 2001 of 6.1% and in February 2002 improved hemoglobin A1C of 5.9%.  PHYSICAL EXAMINATION:  VITAL SIGNS: Pulse 64, respirations 16, blood pressure 120/62.  GENERAL: The patient is a 30 year old white female, slightly overweight for her height, well-developed, no acute distress.  She is alert and oriented, cooperative, very pleasant at the time of examination.  Appears to be a  good historian.  HEENT: Normocephalic, atraumatic.  The patient is noted to wear glasses. EOMI.  Oropharynx clear.  NECK: Supple.  No carotid bruits appreciated.  CHEST: Clear to auscultation and percussion anterior and posterior chest walls.  No rhonchi, rales, or wheezing appreciated.  HEART: Regular rate and rhythm.  No murmur.  S1 and S2 noted.  No rubs, thrills, or palpitations.  ABDOMEN: Soft, round abdomen.  Bowel sounds present.  Nontender. BREAST/GU/RECTAL: Not done, not pertinent to present illness.  EXTREMITIES: Significant to lower extremities.  She does ambulate with somewhat of an antalgic gait to the left.  She is unable to walk heel and toe and maintain position.  She has a 4-5 muscle strength to the left anterior tib, 3-5 over the left EHL, and 3-5 over the left gastroc and psoas.  IMPRESSION:  1. L5-S1 lateral recess and foraminal stenosis.  2. Chronic low back pain.  3. Status post lumbar decompression x3.  4. History of depression.  5. Posttraumatic stress disorder.  6. Hypertension.  7. Type 2 diabetes mellitus.  8. Degenerative disk disease.  9. History of urinary tract infections. 10. Genital herpes. 11. Irritable bowel syndrome.  PLAN: The patient will be admitted to [facility] and undergo a re-do diskectomy and a PLIF with pedicle screws and iliac crest bone graft harvesting versus allograft bone.  Surgery is to be performed by Dr. Jene Every.  The patients medical physician is Dr. Alberteen Sam.  Dr. Alberteen Sam will be notified of the room number on admission and will be consulted if needed for any medical assistance with this patient throughout the hospital course.  Dictated by:   Alexzandrew L. Perkins, P.A.-C. Attending Physician:  Michaelle Copas DD:  06/03/01 TD:  06/03/01 Job: [id number]

## 2010-09-13 NOTE — Op Note (Signed)
[facility]  Patient:    Wendy [name], Wendy [name]                      MRN: [id number] Proc. Date: 03/09/00 Adm. Date:  [id number] Attending:  Pierce Crane                           Operative Report  PREOPERATIVE DIAGNOSIS:  Recurrent disk herniation L5-S1.  POSTOPERATIVE DIAGNOSIS:  Recurrent disk herniation L5-S1  PROCEDURE PERFORMED:  ______ microdiskectomy L5-S1  ANESTHESIA:  General.  ASSISTANT:  Alexzandrew L. Perkins, P.A.-C.  BRIEF HISTORY AND INDICATIONS:  A 30 year old status post microdiskectomy L5-S1 who did well postoperatively and then developed recurrent left lower extremity radicular pain in the outer aspect of the foot, S1 dermatome.  MRI indicated small recurrent disk herniation, paracentral left epidural fibrosis. The patient refractory to conservative treatment.  Therefore operative intervention is indicated for redo diskectomy.  The risks and benefits discussed including bleeding, infection, damage to surrounding structures, DVT, PE, dislocation, need for revision, etc.  DESCRIPTION OF PROCEDURE:  The patient is in supine position.  After the induction of adequate general anesthesia, 1 gram of Kefzol for antimicrobial prophylaxis, the patient was placed on the Perry frame.  All bony prominences were well padded.  The lumbar region was prepped and draped in the usual sterile fashion.  The previous surgical incision was excised and 1/4% Marcaine with epinephrine was infiltrated in the subcutaneous tissues. Subcutaneous tissue was sharply divided.  The dorsal lumbar fascia was identified and divided in line with the skin incision.  Paraspinous muscle was elevated from the lamina of L5 and S1.  McCullough retractor was placed.  The ______ radiographic ______ ______ the intralaminar space of L5-S1. OperatiNG microscope was draped and brought onto the surgical field.  A curette was utilized to demarcate the lamina of L5 and S1 from  the previous laminotomy site.  A small curette was utilized to detach the fibrosis from the cephalad edge of S1 so that a 2 mm Kerrison was then utilized to expand the S1 laminotomy.  Following this, the foramen of S1 was identified, and a foraminotomy was performed with 2 mm Kerrison ______ spur in the S1 nerve root.  The S1 nerve root was found to be stenotic in the S1 foramen.  We then gently mobilized medially with a Penfield four.  The lateral recess was decompressed after immobilization with a nerve fork and a Penfield four freeing the epidural fibrosis from the lateral recess with extensive epidural fibrosis noted.  S1 nerve root was found to be erythematous.  It was gently mobilized with a Penfield four.  Bipolar electrocautery was utilized to achieve hemostasis.  Immobilization was carried proximally to cephalad of the pedicle.  The disk space was then identified and HNP was noted.  This was paracentral to the left.  The thecal sac and fibrosis was mobilized from the annulus in the disk with a Penfield four and gently retracted with a DErrico. Prior to this, a Nicholos Johns four was utilized to detach the fibrosis from the caudad edge of five.  This laminotomy was expanded with 2 mm Kerrison.  I could stick probe placement in foramen of 5 and was found to be patent; however with some fibrosis noted.  This was undercut with the 2 mm Kerrison. The Derrico protecting the S1 nerve root and the thecal sac, the disk space was identified and annulotomy was performed.  Disk material was removed from the interspace and fragments were mobilized with a nerve hook and retrieved with a body pituitary.  Following this, the portions of diskectomy and the nerve root was much more mobile.  There was no residual compression over the ______ hockey stick probe was also placed in the 5 foramen and found to be widely patent with no compression on the 5 root noted.  Hockey stick probe placement in the foramen  of S1 found to be widely patent.  The nerve root was found to be erythematous and edematous but mobile.  We inspected beneath the thecal sac with the [place] four.  The axilla and nerve root without evidence of residual disk material.  A hockey stick probe was then placed in the subannular region to further mobilize disk material which was free with pituitary rongeur.  There was no residual disk material following this.  The nerve roots were widely patent.  ______ CSF leakage.  The wound was copiously irrigated as was the disk space.  Multiple Valsalvas were performed  with no CSF leakage.  Thrombus soaked Gelfoam was placed in the laminotomy defect. The operative microscope that was draped and brought into the surgical field was removed.  McCullough retractor was removed.  Paraspinous muscles were inspected with no active bleeding.  Dorsal lumbar fascia reapproximated with 2-0 Vicryl, subcutaneous tissue reapproximated with 2-0 Vicryl.  Skin was reapproximated with 40 subcuticular PDS.  Wounds reinforced with Steri-Strips. Sterile dressing applied.  The patient was placed supine on the hospital bed and intubated without difficulty  and transported recovery room in stable condition.  The patient tolerated the procedure well with no complications. DD:  03/09/00 TD:  03/09/00 Job: [id number]

## 2010-09-13 NOTE — Discharge Summary (Signed)
[name]  [name]: [id number] MRN: [id number]          Service Type: MED Location: [facility] Attending Physician:  Michaelle Copas Dictated by:   Ed Blalock. Burnadette Peter, M.D. Admit Date:  03/09/2001 Discharge Date: 03/09/2001                             Discharge Summary  HISTORY OF PRESENT ILLNESS:   This is a 30 year old, G1, who presented at 36-2/7 weeks with oligohydramnios, AFI of 3.3. The patient also complained of decreased fetal movement. She is a patient of [facility] with pregnancy complicated with diabetes on an insulin pump, gestational hypertension, borderline oligohydramnios.  MEDICATIONS:                  Insulin pump, Valtrex, Percocet, prenatal vitamins.  ALLERGIES:                    ROBAXIN.  PAST MEDICAL HISTORY:         Significant for diabetes, bipolar disorder, asthma, positive HSV, positive posttraumatic stress disorder, back surgery x 2 and irritable bowel syndrome.  PHYSICAL EXAMINATION:         On admission, vital signs were stable. She was afebrile. Fetal heart rate was 140s and reactive.  [name] COURSE:              The patient was admitted for monitoring to rule out fetal distress as well as PIH labs, a 24-hour urine and a repeat AFI.  #1 - During hospitalization, the patients diabetes was well controlled with self-monitoring and using her own insulin pump.  #2 - OLIGOHYDRAMNIOS:  She had a repeat ultrasound done on December 31, 2000 which showed improvement of her AFI from 3.3 to 9.5.  #3- She had PIH labs done which were normal and her blood pressures stabilized with bed rest in the [name].  #4 - She was continued on Zovirax for HSV throughout the hospitalization. She was stable from this standpoint.  #5 - During the hospitalization she had some difficulty with her chronic back pain. She was given p.o. pain meds for this.  On [name] day #7 she was discharged  home.  DISCHARGE DIET:               ADA diet. No concentrated sweets.  DISCHARGE ACTIVITY:           Bed rest.  DISCHARGE MEDICATIONS:        She is to continue her home Percocet dose as well as to continue her insulin pump for her diabetes.  DISCHARGE FOLLOWUP:           She is to be seen in Maternity Admissions twice weekly for fetal testing.  DISCHARGE DIAGNOSES:          1. Intrauterine pregnancy.                               2. Oligohydramnios, resolved.                               3. Type 1 diabetes, complicating pregnancy.  4. Genital herpes, stable.                               5. Chronic back pain, stable. Dictated by:   Ed Blalock. Burnadette Peter, M.D. Attending Physician:  Michaelle Copas DD:  04/19/01 TD:  04/21/01 Job: [id number]

## 2010-09-13 NOTE — Discharge Summary (Signed)
[facility]  Patient:    Wendy [name], Wendy [name]                      MRN: [id number] Adm. Date:  [id number] Disc. Date: [id number] Attending:  Pierce Crane Dictator:   Alexzandrew L. Perkins, P.A.-C.                           Discharge Summary  ADMITTING DIAGNOSES: 1. 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 2. Insulin-dependent diabetes mellitus. 3. Gravid, 17 weeks. 4. Constipation.  DISCHARGE DIAGNOSES: 1. 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 2. Insulin-dependent diabetes mellitus. 3. Gravid, 17 weeks. 4. Constipation.  PROCEDURE:  The patient was taken to the operating room on August 19, 2000, and underwent a medial meniscectomy on the left at L5-S1 with a foraminotomy of L5 and removal of synovial cyst and also foraminotomy of S1.  Surgeon was Dr. Jene Every with assistant Dr. Tomasa Blase.  CONSULTATIONS: 1. Medical/endocrine, Dr. Jacky Kindle. 2. OB/GYN, Dr. Olivia Mackie.  HISTORY OF PRESENT ILLNESS:  The patient is a 30 year old female who has had previous back surgery x 2 of the lumbar spine.  She had been doing fairly well until Thursday prior to admission when she woke up and sat in bed having radiation of pain down into the lower extremity on the left.  She has been operated by Dr. Shelle [name] in the past.  She came into the office and was seen. She was seen by Dr. Debria Garret.  It was noted that she was spotting, plus she was [redacted] weeks pregnant.  She was referred back to her OB/GYN in [place], [place], and was seen by Dr. Fernanda Drum.  She was given RhoGAM injection x 1 which took care of the spotting.  She was later followed back up by Dr. Shelle [name] for low back pain radiating to the lower extremity on  the left with weakness.  She could not ambulate except with a walker due to fear of falling on the left side.  She was evaluated and felt she would need to be admitted to the hospital for pain control and possible surgery.  The patient was subsequently admitted to [facility].  LABORATORY DATA AND X-RAY FINDINGS:  CBC on admission with hemoglobin 12.2, hematocrit 35.7, white cell count 10.6, red cell count 4.24.  PT/PTT on admission were 12.7 and 27 respectively with an INR of 1.0.  BMP all within normal limits.  Ultrasound taken on August 11, 2000, with cervical length of 3.9 cm, low lying posterior placenta, however, no evidence of previa.  MRI dated August 12, 2000, with postoperative degenerative changes at L5-S1 level noted.  These include a probable small to moderate sized posterolateral disc herniation located anterior lateral to the left S1 nerve root.  This is called a mild flattening of the nerve root sheath without evidence of nerve compression.  A postcontrast examination would be necessary to accurately differentiate between herniated disc material and scar tissue.  HOSPITAL COURSE:  The patient was admitted to [facility] and placed at bed rest due to the severe pain.  Medical consult and OB/GYN consult was called.  The patient was seen and evaluated by  Dr. Jacky Kindle.  The patient was also seen and evaluated by Dr. Billy Coast from an OB/GYN standpoint.  An ultrasound was ordered by the OB/GYN service to assess the uterus and the placenta.  MRI was ordered.  The patient was placed at bed rest.  The patient was sent over for an MRI on August 12, 2000.  MRI as noted above.  There was some discussion whether the patient would be transferred to [facility] for her OB needs.  It was decided that the patient would remain at [facility].  The MRI was reviewed.  Fetal heart tones were monitored closely and checked periodically to ensure that they were present.   The patient had essentially been placed at bed rest with only bathroom privileges.  She continued to experience significant pain throughout the hospital course despite the fact that she had been placed on bed rest and provided analgesics for the pain.  The significant findings on the MRI were reviewed.  Anesthesia was consulted for possible consideration of surgery.  Due to her insulin-dependent diabetes, Dr. Jacky Kindle followed the patient very closely and adjusted medications and regulated her diabetes very closely through the hospital course.  The patient was seen and evaluated by anesthesia and all risk factors including her diabetes, obesity and her pregnancy were discussed including risks and benefits of anesthesia and surgery.  It was decided that the patient would proceed with surgery.  Arrangements were made through [facility] and also the operating room for [facility] staff to come over and assist the patient during her preoperative, intraoperative and postoperative care.  Once all arrangements had been made and she had discussed her situation with orthopedics, internal medicine, OB/GYN and anesthesia, it was decided that the patient would be taken to the operating room on August 19, 2000.  She underwent the above-stated procedure.  Fetal heart tone and rate were monitored intraoperatively and postoperatively.  They were monitored very closely on the day of surgery.  The patient tolerated the procedure well. There were no adverse findings associated with the fetus during or following the procedure.  The patient did have some complaints of muscle pain and spasm following the surgery.  However, pain did improve throughout the hospital course.  Physical therapy was consulted to assist with gait training and ambulation once the procedure had been performed.  As stated before, Dr. Jacky Kindle and partners did assist in very close strict management of the patients diabetes.  She  continued to improve and by August 21, 2000, the patient was doing quite well.  She had been up moving around.  Her pain had improved and she was asking to be discharged home.   DISPOSITION:  The patient was discharged home on August 21, 2000.  DISCHARGE MEDICATION:  Percocet #40 p.r.n. pain.  DIET:  Resume preoperative diet as tolerated.  ACTIVITY:  She is placed on back precautions.  Walking as tolerated.  Dressing change daily.  CONDITION ON DISCHARGE:  Stable and orthopedically improved. DD:  09/18/98 TD:  09/18/00 Job: [id number]

## 2010-09-13 NOTE — Assessment & Plan Note (Signed)
[facility]                                   ON-CALL NOTE   NAME:[name], Wendy                          MRN:          [id number]  DATE:02/07/2006                            DOB:          04/11/1981    TELEPHONE MESSAGE   Telephone number:  [phone]   The patient a type 1 diabetic, forgot to get her insulin filled.  She has no  insulin.  Called in one bottle of Humalog.  Advised her to get with Dr.  Everardo All so this does not happen in the future.            ______________________________  Eugenio Hoes Tawanna Cooler, MD      JAT/MedQ  DD:  02/07/2006  DT:  02/09/2006  Job #:  [id number]   cc:   Gregary Signs A. Everardo All, MD

## 2010-09-13 NOTE — Op Note (Signed)
[facility]  PatientKennon [name], Wendy [name] Visit Number: [id number] MRN: [id number]          Service Type: OBS Location: 910A 9105 [id number] Attending Physician:  Tammi Sou Dictated by:   Conni Elliot, M.D. Proc. Date: 01/16/01 Admit Date:  01/13/2001                             Operative Report  PREOPERATIVE DIAGNOSIS:       Intrauterine pregnancy at term, insulin-dependent diabetes mellitus, oligohydramnios, decreased fetal activity, repetitive severe fetal heart and variable decelerations.  POSTOPERATIVE DIAGNOSIS:      Intrauterine pregnancy at term, insulin-dependent diabetes mellitus, oligohydramnios, decreased fetal activity, repetitive severe fetal heart and variable decelerations.  OPERATION:                    Low transverse cesarean section.  SURGEON:                      Conni Elliot, M.D.  ANESTHESIA:                   Spinal.  OPERATIVE FINDINGS:  Apgars 8 and 9, cord PH 7.27.  Placenta was sent to pathology.  There was no evidence of nuchal or shoulder cord.  DESCRIPTION OF PROCEDURE:     After placing spinal anesthetic, the patient was placed in the left lateral decubitus position, receiving oxygen and was prepped and draped in the sterile fashion.  A low transverse Pfannenstiel incision was made.  Incision was made through the skin and subcutaneous and fashion.  Rectus muscle separated in the midline.  Peritoneum of the abdomen entered.  Bladder flap created.  Low transverse uterine incision was made. Fluid was clear.  The baby was delivered from the vertex position.  Cord was doubly clamped and cut and handed to the neonatologist in attendance. Placenta was delivered spontaneously.  The uterus, bladder flap, anterior peritoneum, fascia, and skin closed in _____ fashion.  Estimated blood loss less than 800 cc.  Needle and sponge count correct. Dictated by:   Conni Elliot, M.D. Attending Physician:   Tammi Sou DD:  01/16/01 TD:  01/17/01 Job: [id number]

## 2010-09-13 NOTE — Discharge Summary (Signed)
[facility]  Patient:    [name], Wendy                      MRN: [id number] Adm. Date:  [id number] Disc. Date: [id number] Attending:  Carrie Mew CC:         [name], M.D. [facility]                           Discharge Summary  DISCHARGE DIAGNOSES: 1. Febrile illness, questionable viral meningitis. 2. Diabetes.  OPERATIONS/PROCEDURES:  None.  CONSULTATIONS:  None.  HISTORY OF PRESENT ILLNESS:  Please see that dictated per Dr. Lovell Sheehan on October 26, 1999.  HOSPITAL COURSE:  The patient is a 30 year old white female with a history of diabetes and morbid obesity who presents for the above.  Cerebrospinal fluid was essentially negative on admission but the patient was febrile.  There was a white blood cell count of 16.9 with severe headache and the patient was admitted for further observation.  She did well overnight with fluids, Tylenol and was covered with doxycycline.  In the morning she was tired but otherwise felt much improved and was asking to go home.  The patient was eating well, ambulatory, with minimal symptoms.  She was felt to have gained maximal benefit from this hospitalization.  DISPOSITION:  The patient was discharged to home in good condition.  No activity or dietary restrictions beyond that.  As previously, she should follow her diabetic diet.  DISCHARGE MEDICATIONS:  Glucophage as per previous dose and doxycycline 100 mg b.i.d.  FOLLOWUP:   She will need to follow with Dr. Ruthine Dose in one to two weeks.  It should be noted that she has a [facility] spotted fever and Lyme titer pending at time of discharge. DD:  10/27/99 TD:  10/27/99 Job: [id number]

## 2010-09-13 NOTE — Op Note (Signed)
[facility]  Patient:    Wendy [name], Wendy [name]                      MRN: [id number] Adm. Date:  [id number] Disc. Date: [id number] Attending:  Pierce Crane                           Operative Report  PREOPERATIVE DIAGNOSIS:  Herniated nucleus pulposus, L5-S1 left.  POSTOPERATIVE DIAGNOSIS:  Herniated nucleus pulposus, L5-S1 left.  PROCEDURE:  Microdiskectomy, L5-S1 left.  ANESTHESIA:  General.  ASSISTANT:  Georges Lynch. Gioffre, M.D.  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  No retention signs.  Diminished plantar flexion strength, diminished Achilles.  Refractory to conservative treatment. Operative intervention was indicated for decompression by diskectomy.  Risks and benefits were discussed, including bleeding, infection, damage to adjacent structures, CSF leakage, epidural fibrosis, recurrent disk herniation, etc.  DESCRIPTION OF PROCEDURE:  Patient in supine position after adequate general anesthesia, 1 g Kefzol IV for antimicrobial prophylaxis.  She was placed prone on the Pullman frame and all bony prominences well padded.  The lumbar area was prepped and draped in the usual sterile fashion.  Two 18 gauge spinal needles were used to localize the 5-1 interspace, confirmed with x-ray. Marcaine 0.25% with epinephrine was then infiltrated in the subcutaneous tissue.  An incision was made from the spinous process of L5-S1, subcutaneous tissue was dissected, electrocautery was utilized to achieve hemostasis.  The dorsal lumbar fascia was then identified, divided in line with the skin incision.  The paraspinous muscles were elevated from the lamina of L5 and S1. The _____ retractor was placed.  The operating microscope was draped and brought into the surgical field.  A confirmatory radiograph obtained with a Penfield #4 in the interspace.  Ligamentum flavum removed from the  interspace utilizing the 2 and 3 mm Kerrison, preserving the neural elements at all times.  The Penfield #4 was then utilized to probe and identify the S1 nerve root.  It was displaced and compressed by a large HNP.  The nerve was gently mobilized medially.  Bipolar electrocautery was utilized to achieve hemostasis, and a 15 blade was used to perform an annulotomy.  A copious portion of disk material was removed from the disk space, and the extruded fragments were removed as well.  Some disk degeneration noted and disk space narrowing.  Following this, an _____ probe placed in the foramen at L5 and S1 and found to be widely patent without obstruction, no residual disk beneath the thecal sac caudad or cephalad to the disk space or within the axilla of the nerve root.  The disk space was then copiously irrigated.  Inspection revealed no CSF leakage or residual compression.  No bleeding.  The nerve root was found to be erythematous and edematous.  After copious irrigation once again, 2 cc of fentanyl was placed in the epidural space.  The _____ retractor was then removed, paraspinous muscles inspected and no evidence of active bleeding.  The dorsal lumbar fascia reapproximated with 0 Vicryl simple sutures, the subcutaneous tissue reapproximated with 2-0 Vicryl simple sutures, the skin was reapproximated with 4-0 Prolene subcuticular and reinforced with Steri-Strips.  The wound was dressed sterilely and the patient laid supine on the hospital bed and extubated without difficulty and transported to recovery in satisfactory condition.  The patient tolerated the procedure well,  no complications. DD:  09/14/99 TD:  09/19/99 Job: [id number]

## 2010-09-13 NOTE — Discharge Summary (Signed)
[place]  Patient:    Wendy [name], Wendy [name] Visit Number: [id number] MRN: [id number]          Service Type: SUR Location: 5000 5004 [id number] Attending Physician:  Pierce Crane Dictated by:   Dorie Rank, P.A. Admit Date:  06/09/2001 Discharge Date: 06/14/2001                             Discharge Summary  ADMISSION DIAGNOSES:  1. L5-S1 lateral recess and foraminal stenosis.  2. Depression.  3. Post traumatic stress disorder, history of lithium treatment.  4. Hypertension.  5. Diabetes mellitus type 2.  6. Degenerative joint disease.  7. Irritable bowel syndrome.  8. Genital herpes.  9. History of urinary tract infection. 10. History of multiple herniated discs.  DISCHARGE DIAGNOSES:  1. L5-S1 lateral recess and foraminal stenosis.  2. Depression.  3. Post traumatic stress disorder, history of lithium treatment.  4. Hypertension.  5. Diabetes mellitus type 2.  6. Degenerative joint disease.  7. Irritable bowel syndrome.  8. Genital herpes.  9. History of urinary tract infection. 10. History of multiple herniated discs.  PROCEDURE:  On 06/09/2001 the patient was taken to the operating room and underwent a re-do diskectomy at L5-S1, posterior lumbar interbody fusion utilizing Brantigan cages at L5-S1, pedicle screw instrumentation utilizing two Acromed pedicle screws, lateral fusion utilizing local cancellous and allometric bone graft.  SURGEON:  Javier Docker, M.D.  ASSISTANT:  Patricia Nettle, M.D.  ANESTHESIA:  General anesthesia.  COMPLICATIONS:  None.  HISTORY OF PRESENT ILLNESS:  The patient is a 30 year old female who is well known to Dr. Rosana Fret practice, having previously undergone three back surgeries in the past.  She had multiple disc problems. Her symptoms date back to around May of 2001 when she sustained a work related injury.  She has had progressive neurological deficits and underwent a left L5-S1  hemilaminectomy and diskectomy at that time.  The patient had a recurrence back in November and underwent a revision with L5-S1 microdiskectomy and then unfortunately developed some recurrence of radicular pain back in April of 2002 when she was four months pregnant.  She underwent a third decompression with only partial improvement of her symptoms.  She has had continued pain.  She has been seen in consultation by Dr. Sharolyn Douglas for a second opinion and then referred back to Dr. Shelle Iron with recommendations for revision of the L5-S1 laminectomy and decompression of the nerve roots.  It was also recommended that the patient undergo a fusion at the L5-S1 disc space.  The recommendations were reviewed with the patient at length and she elected to proceed with the surgery.  She was cleared by her family medical doctor, Dr. Birdena Jubilee for the above-stated surgery.  CONSULTATIONS:  None.  HOSPITAL COURSE:  The patient had the above-stated surgery without complications as mentioned above.  She was placed on a PCA for pain control. While in the operating room the incision was dressed in a sterile fashion. Her dressing was clean, dry and intact on postoperative day #1.  The dressing was changed daily started on postoperative day #2 and thereafter was found to be free of any erythema or drainage.  A Hemovac drain was placed while in the operating room. This was discontinued on postoperative day #2 without difficulty.  Appropriate intravenous antibiotics were used postoperatively for infection control.  She was weaned off her PCA and utilized  p.o. pain medications for pain control prior to her discharge home.  Her home medications were resumed.  She was placed on a sliding scale insulin and her CBGs were monitored routinely.  She was placed on an ADA 2000 kilocalorie diet and tolerated this well.  She had had bowel movements prior to her discharge home.  Vital signs and neurovascular checks were  checked routinely throughout her hospitalization.  She did run a mild fever and it was felt this was due to atelectasis and incentive spirometry was encouraged.  It was recommended that a urinalysis be checked prior to her discharge home, however, the patient denied this.  A Foley catheter was placed while in the operating room and this was discontinued and she was voiding well on her own without difficulty throughout the remainder of her hospital stay.  Physical therapy worked with the patient for ambulation.  She was fitted with a brace from [employer] and she tolerated this well.  She wore TED stockings for deep venous thrombosis prophylaxis while in the hospital.  She did defervesce while in the hospital. On 06/14/2001 it was felt she was medically and orthopedically stable for discharge.  LABORATORY DATA VALUES:  Hemoglobin and hematocrit on 06/04/2001 15.1 and 45.1 respectively.  On 06/11/01 the hemoglobin was 10.2 and hematocrit was 30.5.  BMET:  06/10/2001 showed sodium 130, glucose 153, BUN 4.  On 06/11/2001 the sodium was back up to 136, glucose 168, BUN 2.  LIVER FUNCTION TESTS:  06/04/2001 within normal limits.  BLOOD TYPE:  06/04/2001:  A negative.  RADIOLOGY, INTRAOPERATIVE FLUOROSCOPY:  06/09/01 revealed surgical fixation fusion at L5-S1.  CHEST X-RAY:  A two view chest x-ray from 09/12/2000 was negative.  ELECTROCARDIOGRAM:  No tracings available on the chart at the time of this dictation.  CONDITION ON DISCHARGE:  Improved.  DISCHARGE PLANS AND MEDICATIONS:  Patient was discharged home in the care of her parents.  She is to have daily dressing changes, wear her LSO when she is up, shower with assistance, ADA 2000 kilocalorie diet, she can ambulate smooth surfaces as tolerated with low back precautions, no lifting, pushing or bending.  She is to follow up with Dr. Shelle Iron in seven to ten days and call for an appointment.  She is given prescription for OxyContin sustained  release, Percocet, Ambien and Valium.  She can resume her home medications including  her insulin, Altace, Neurontin and Ortho Evra patch.  She can take Tylenol for fever.  She is to continue her incentive spirometry at home, call should her fever be greater than 101, call should she have any questions or concerns prior to her appointment with Dr. Shelle Iron. Dictated by:   Dorie Rank, P.A. Attending Physician:  Pierce Crane DD:  06/30/01 TD:  07/01/01 Job: [id number]

## 2010-09-13 NOTE — Assessment & Plan Note (Signed)
[facility]                           GASTROENTEROLOGY OFFICE NOTE   NAME:[name], Wendy L                        MRN:          [id number]  DATE:12/17/2005                            DOB:          May 05, 1980    FOLLOWUP NOTE:   PRIMARY CARE PHYSICIAN:  Wendy [name], M.D.   GASTROINTESTINAL PROBLEM LIST:  Gastritis.  Seen on EGD in August of 2007.  Likely NSAID induced.  Was causing severe pains that have improved since  stopping nonsteroidal anti-inflammatory drugs and proton pump inhibitor.  Biopsies did not show any acute inflammation.  No H. pylori.  CT scan in  June 2007 without IV contrast essentially normal.   INTERVAL HISTORY:  I last saw Wendy [name] at the time of her upper endoscopy for her  severe abdominal pain.  She did have some gastritis, and, although this was  not confirmed on biopsies, I do suspect that she had NSAID-related gastritis  causing her severe abdominal pain.  She has been off of NSAIDs now for 10  days and on a PPI twice daily.  With this regimen, she has dramatically  improved.  Her pains are only a bother for most of the time.  She does have  some postprandial discomforts that are a bit worse, but it is a 4 compared  to a 10 that she was earlier.  She does have mild nausea with meals, too.   PHYSICAL EXAMINATION:  VITAL SIGNS:  Weight 164 pounds, blood pressure  110/60, pulse 78.  ABDOMEN:  Soft, nontender, nondistended.  Normal bowel sounds.   ASSESSMENT AND PLAN:  32. A 30 year old woman with abdominal pain likely from nonsteroidal      antiinflammatory drug-induced gastritis.  She is much better since      stopping the nonsteroidal anti-inflammatory drugs and starting proton      pump inhibitors.  She will continue this regimen for the next 6 weeks      and return to see me at that time.  2. She does have some nausea and some mild postprandial discomforts now.      This may just be a residual from her gastritis, or  perhaps she has some      side effects from the Chantix that was just started.  The #1 side      effect is nausea and vomiting.  3. Lastly, she is a type 1 diabetic, and perhaps she suffers from some      gastroparesis.  I will keep these in mind when I see her at her next      visit.                                   Wendy Fee, MD   DPJ/MedQ  DD:  12/17/2005  DT:  12/17/2005  Job #:  [id number]   cc:   Wendy Signs A. Everardo All, MD

## 2010-09-13 NOTE — Discharge Summary (Signed)
NAME:  [name], Wendy                             ACCOUNT NO.:   MEDICAL RECORD NO.:  [id number]                    PATIENT TYPE:   LOCATION:                                       FACILITY:   PHYSICIAN:  Charles A. Clearance Coots, M.D.             DATE OF BIRTH:   DATE OF ADMISSION:  DATE OF DISCHARGE:                                 DISCHARGE SUMMARY   DISCHARGE DIAGNOSES:  1. Intrauterine pregnancy at 39 weeks, delivered.  2. Low transverse cesarean section secondary to failure to progress.  3. Insulin-dependent diabetes.  4. Rh negative blood type.   PROCEDURE:  Low transverse cesarean section secondary to failure to  progress.   DISCHARGE MEDICATIONS:  1. Ibuprofen 600 mg one tablet p.o. q.8h.  2. Darvocet one to two pills q.6h.  3. Prenatal vitamins one q.d.  4. Glucophage as previously directed.   FOLLOW UP:  The patient is to follow up in [facility] to have her staples removed  four days after discharge.   PRESENTING HISTORY:  This is a 30 year old G1 who presented at 39 weeks 1  day gestation dated by LMP plus second trimester ultrasound who was coming  in for an induction of labor secondary to insulin-dependent diabetes.  The  patient had her prenatal care at [facility] with onset of care in the  first trimester.  Pregnancy complications:  The patient had had insulin-  dependent diabetes.  Used roughly 150 units of insulin q.d. during  pregnancy.   MEDICATIONS:  The patient was on an insulin pump and prenatal vitamins.   GYNECOLOGICAL HISTORY:  The patient was herpes positive.  No outbreaks  within the last year.   PAST MEDICAL HISTORY:  1. Insulin-dependent diabetes.  2. Asthma.  3. Depression.  4. History of three prior back surgeries secondary to disk herniation, last     one three months prior to presentation.   SOCIAL HISTORY:  The father of the baby was involved.  No smoking.   PRENATAL LABORATORIES:  The patient was A- blood type, antibody negative.  Rubella  equivocal.  Hepatitis B surface antigen negative.  Chlamydia  negative.  Gonorrhea negative.  GBS unknown.  HIV negative.  Syphilis  negative.   PHYSICAL EXAMINATION:  GENERAL:  Within normal limits.  PELVIC:  Digital cervical examination:  Cervix is 2 cm dilated, 50% effaced,  and posterior vertex was felt.  Baseline fetal heart rate was 130 with good  variability, good reactivity, no decelerations.  Contractions were with  frequency of q.6-8 minutes.   ASSESSMENT:  This was an induction of labor secondary to insulin-dependent  diabetes.   HOSPITAL COURSE:  The patient was admitted for induction of labor.  Low dose  Pitocin was begun as patient was contracting too frequency for Cytotec.  The  patient was hospitalized but as Pitocin was started patient was continuing  on her insulin pump during  the pregnancy.  She had it preprogrammed for  early labor and patient had failure to progress with the Pitocin induction.  Therefore, Cervidil was tried x2.  This also was unsuccessful.  Secondary to  failure to progress over the course of two days, it was decided for the  patient to go to low transverse cesarean section.  For that surgery, please  see dictation of January 16, 2002.  The patient had routine postpartum  care.  She did give birth to a viable female.  She did bottle feed after  delivery.  She was started again on her home prepregnancy insulin regimen  and was to follow up with her primary care [name] and for her six week  postpartum check.  Abdominal examination was benign on day of discharge.  The patient planned to bottle feed.     Bradly Bienenstock, M.D.                         Charles A. Clearance Coots, M.D.    [name]  D:  01/16/2002  T:  01/18/2002  Job:  [id number]

## 2010-09-13 NOTE — H&P (Signed)
[facility]  Patient:    Wendy [name]                          MRN: [id number] Adm. Date:  08/10/00 Attending:  Javier Docker, M.D. Dictator:   Druscilla Brownie. [name], P.A. CC:         Kerrie Buffalo. Cliffton Asters, M.D., [facility] [address],             [place]. [address], [place], [place] [address]   History and Physical  DATE OF BIRTH: 1980-08-21  CHIEF COMPLAINT: "Pain in my back and left leg, with numbness and weakness."  HISTORY OF PRESENT ILLNESS: This 30 year old white female, who has had previous back surgery x 2 to the lumbar spine in the past, was doing fairly well until this last Thursday when she awoke and sat up in bed with pain in her back, with radiation to the left lower extremity.  She had been operated on by Dr. Shelle Iron of our practice before.  She came to the office to be seen by the first available physician.  She was seen by Dr. Montez Morita here in the office and as she was spotting, plus she was [redacted] weeks pregnant, he recommended she return to her OB physician in [place], [place].  She was seen by Dr. Jacques Earthly of [facility]  She was given RhoGAM injection x 1, which took care of the spotting.  She was discharged Sunday and told to return here for re-evaluation.  Today she was seen in Dr. Cecile Hearing clinic.  She had pain in the low back radiating to the left lower extremity with weakness.  She really could not ambulate except with a walker due to fear of falling on the left side.  She was seen today in a wheelchair.  Dr. Shelle Iron evaluated the patient and felt that she needed admission to the [name] for pain control and to be sure of what diagnostic studies we could perform with safety to her baby.  PAST MEDICAL HISTORY: The patient is a "brittle" diabetic, who has an insulin pump.  She otherwise is in relatively good health.  She is 17 weeks gravida.  CURRENT MEDICATIONS:  1. Medrol  steroid Dosepak, prescribed by Dr. Delford Field.  2. Percocet one to two q.4h to q.6h, again prescribed by Dr. Delford Field.  3. Prenatal vitamins.  PAST SURGICAL HISTORY:  1. Laparoscopy about two years ago.  2. Surgery for adhesions to the uterus.  ALLERGIES: ROBAXIN.  SOCIAL HISTORY: The patient is married.  No intake of tobacco or ETOH.  She lives in [place], [place].  FAMILY HISTORY: Unknown as the patient is adopted.  REVIEW OF SYSTEMS: CNS: No seizure disorder or problems with double vision, but numbness and tingling and numbness in the left lower extremity and weakness.  CARDIOVASCULAR: No chest pain, no angina, no orthopnea. CARDIORESPIRATORY: No productive cough, no hemoptysis, no shortness of breath. GASTROINTESTINAL: The patient is constipated.  She had her last BM one week ago.  She has not taken any strong laxatives.  PHYSICAL EXAMINATION:  GENERAL: She is an alert and cooperative, in obvious distress, 30 year old female, accompanied by her stepmother.  VITAL SIGNS: Blood pressure 110/52, pulse 80, respiratory 12.  HEENT: Head normocephalic.  PERRLA.  EOMI.  Oropharynx clear.  CHEST: Clear to auscultation.  No rhonchi or rales.  HEART: Regular rate and  rhythm.  No murmurs heard.  ABDOMEN: Soft, nontender.  Bowel sounds present.  Fetal auscultation is not done.  GU/RECTAL/PELVIC/BREAST: Examinations not done, not pertinent to present illness.  EXTREMITIES: Weakness in the left hallux.  No straight leg raise is attempted due to the patients obvious extreme discomfort.  ADMISSION DIAGNOSES:  1. 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  2. Insulin-dependent diabetes mellitus.  3. Gravid 17 weeks.  4. Constipation.  PLAN: The patient will be admitted for pain control, bed rest, possible MRI/CT scan/myelogram, depending on what OB physician approves for this investigation of recurrent discopathy.  We will ask for an OB consultation while  she is at [facility] to follow along with [place] as well as to tell us what tests we can do, as well as an endocrine consultation for her diabetes. DD:  08/10/00 TD:  08/10/00 Job: [id number]

## 2010-09-13 NOTE — Discharge Summary (Signed)
[facility]  Patient:    Wendy [name] [facility] LEE Visit Number: [id number] MRN: [id number]          Service Type: ANT Location: [facility] Attending Physician:  Michaelle Copas Dictated by:   Marvis Moeller, C.N.M. Adm. Date:  [id number] Disc. Date: 12/01/00                             Discharge Summary  ADMISSION DIAGNOSES:          1. A 31+ week intrauterine pregnancy.                               2. Insulin-dependent pregestational diabetes.                               3. Threatened preterm labor.  DISCHARGE DIAGNOSES:          1. A 32-week intrauterine pregnancy.                               2. Pregestational diabetes with good glycemic                                  control.                               3. Without evidence of preterm labor.  HISTORY:                      Ms. Wendy [name] is a 30 year old, G1, presenting at 31-5/7 weeks by good dating criteria. Her 14-week ultrasound was a two day variation from her LMP. She had a chief concern of cramping and also had had a long-standing history of some increased vaginal discharge which she was concerned for the leaking. She was unaware of contractions. She had no gush of fluid, no vaginal bleeding, although she did have some spotting in first and early second trimester. Her fetal activity was reported as very good. She had no preeclampsia symptoms.  PRENATAL COURSE:              She had begun care at [facility] in [facility] and then she saw Dr. Alberteen Sam who had tried to refer her to [facility]. That was an insurance problem, then she went to [facility] for one visit, and eventually I began following her in the Maternity Admissions Unit for weekly visits. Her prenatal course has been significant for diabetes with fairly good glycemic control on the insulin pump. She had been followed by the diabetic teaching nurse in [facility] with whom she had a long-standing relationship  and multiple small changes in dosages and basal rate had been made during the pregnancy. Her glycohemoglobin, however, was ______ below 5 in the first trimester. She had been receiving fetal testing due to a borderline low amniotic fluid index in early third trimester. For this reason, she was having weekly BPPs and NSTs. Her pregnancy course is also significant for having had back surgery at 17 weeks. She had a medial meniscectomy on the left at level of L5-S1. She has had a foramen ostomy of S1. She  had also had two previous back surgeries prepregnancy. She was seen by her orthopedist once during this pregnancy for exacerbation of her low back pain without focal symptoms although she did have radiation of her pain down the back of her leg. He evaluated this and was intending to follow her up postpartum.  OBSTETRIC HISTORY:            Primigravida.  GYNECOLOGIC HISTORY:          Negative STIs.  MEDICAL HISTORY:              Also significant for having had depression on SSRIs and having had HSV with her last outbreak in first trimester of her pregnancy.  SURGICAL HISTORY:             As above.  FAMILY AND SOCIAL HISTORY:    Noncontributory. Negative tobacco, alcohol, drugs. Patient is unemployed.  PRENATAL LABORATORY DATA:     Significant for being Rh negative. She did receive RhoGAM. She was GBS negative in July. Her GC and Chlamydia were negative. Her AFI, as indicated above, had been in the 7 to 9 range. Most recently, AFI was 9.1 with normal uterine artery Dopplers and MCA Dopplers.  ADMISSION PHYSICAL EXAMINATION:                  Temperature 97.3, blood pressure 146/85. She was in no distress. Heart RRR. Lungs clear to auscultation. Abdomen soft, nontender, consisted with 32-week gestation. Extremities trace edema. Neurologic: DTRs 2+ without clonus. Speculum exam: There was no pooling, negative Nitrazine, negative ferning, physiologic vaginal discharge. Digital exam  revealed cervix to be 1/50%/-3 with some development of the lower segment which had been noted on previous visits. Fetal heart rate was baseline 130s, reactive at 10 beat-per-minute accelerations. Variability was normal, average, no decelerations. No distinct uterine contractions although there was some uterine activity consistent with irritability.  Wet prep was done showing moderate WBCs and few clue cells. GC, Chlamydia, GBS were done on August 3 and subsequently found to be negative. Urinalysis was negative.  The patient was admitted for observation and further evaluation. Her glycemic control during hospitalization was excellent and no changes were made in her insulin pump dosages. She was on continuous monitoring and was given IV Unasyn for possible cervicitis. However, when her cultures came back negative, this was discontinued and after two and half days of hospitalization, she was found to be in stable condition with an unchanged cervical exam and no contractions on fetal monitoring, no significant contractions. She was discharged to follow up with Marvis Moeller, C.N.M. in the Maternity Admissions Unit in one week and also I did a phone followup with her. She was asymptomatic for preterm labor and following her modified bed rest regimen and her pelvic precautions.  DISCHARGE MEDICATIONS:        1. Insulin pump.                               2. Augmentin 875 mg one p.o. b.i.d. for                                  eight days.                               3. Prenatal vitamin one a day.  4. She was to continue on her ongoing                                  Percocet which she had been needing for                                  her back pain, taking approximately one                                  per night. Again, she was cautioned to                                  minimize the amount of Percocet she is                                   taking.  CONDITION ON DISCHARGE:       She was discharged in good condition. DD:  12/14/00 TD:  12/14/00 Job: [id number]

## 2010-09-13 NOTE — Discharge Summary (Signed)
[facility]  Patient:    Wendy [name], Wendy [name]                      MRN: [id number] Adm. Date:  [id number] Disc. Date: [id number] Attending:  Pierce Crane Dictator:   Ralene Bathe, P.A.                           Discharge Summary  ADMISSION DIAGNOSES: 1. Acute herniated nucleus propulsus at L5-S1 on the left. 2. Recent bronchitis. 3. Recent urinary tract infection. 4. History of irritable bowel syndrome. 5. Non-insulin dependent diabetes mellitus. 6. History of asthma.  DISCHARGE DIAGNOSES: 1. Acute herniated nucleus propulsus at L5-S1 on the left. 2. Recent bronchitis. 3. Recent urinary tract infection. 4. History of irritable bowel syndrome. 5. Non-insulin dependent diabetes mellitus. 6. History of asthma. 7. Status post microdiscectomy at L5-S1 on the left.  OPERATIONS:  Microdiscectomy of L5-S1 on the left by Dr. Shelle Iron, assisted by Dr. _______, under general anesthesia on Sep 14, 1999.  CONSULTS:  None.  BRIEF HISTORY:  The patient is a 30 year old female with about a three day history of acute back and left lower extremity.  She initially went to the emergency room and had a CT scan.  This did show an acute herniated nucleus propulsus at L5-S1 on the left.  She was followed up in the office, however was unable to maintain function and conservative treatment.  Therefore, she was required to be admitted for microdiscectomy.  HOSPITAL COURSE:  The patient was admitted and underwent the above named procedure and tolerated this well.  All appropriate intravenous antibiotics and analgesias were provided.  Postoperatively, the patient was placed on back precautions and was allowed to ambulate ad lib.  Her left leg pain was greatly relieved.  She did have some mild weakness in the left lower extremity which was present preoperatively, but however, she was grossly neurovascularly intact with no bowel or bladder symptoms postoperatively.   On Sep 15, 1999, she was medically and orthopedically stable for discharge to home.  LABORATORY:  Admission white blood cell count was mildly elevated at 16.5, probably from steroid induced leukocytosis.  Routine chemistries on admission showed her glucose to be 154.  Coagulation times were within normal limits. Urinalysis was negative.  Urine culture was negative.  Chest x-ray showed no active disease.  Intraoperative lumbar films showed localization at the appropriate level.  CONDITION ON DISCHARGE:  Stable and improved.  DISCHARGE PLANS:  The patient is being discharged home in the care of her family.  Continue all back precautions.  MEDICATIONS ON DISCHARGE:  Resume home medications. 1. Percocet, #40, 1-2 one every four to six hours p.r.n. pain. 2. Robaxin 500 mg, #30, one every eight hours p.r.n. spasm.  She is to    complete her Dosepak, after that go to two Aleve twice a day with food.  FOLLOW-UP:  The patient is to follow-up in 10 days postoperatively, call for the time.  She was given a rolling walker for home use.  DIET:  Resume regular diet as preoperatively. DD:  10/16/99 TD:  10/17/99 Job: [id number]

## 2010-09-13 NOTE — Consult Note (Signed)
   NAME:  [name], Wendy L                           ACCOUNT NO.:  [id number]   MEDICAL RECORD NO.:  [id number]                   PATIENT TYPE:  EMS   LOCATION:  ED                                   FACILITY:  [facility]   PHYSICIAN:  Lindaann Slough, M.D.               DATE OF BIRTH:  July 26, 1980   DATE OF CONSULTATION:  12/15/2002  DATE OF DISCHARGE:                                   CONSULTATION   REPORT TITLE:  UROLOGY CONSULTATION   CONSULTING PHYSICIAN:  Lindaann Slough, M.D.   REFERRING PHYSICIAN:  Javier Docker, M.D.   REASON FOR CONSULTATION:  Urinary retention.   HISTORY OF PRESENT ILLNESS:  The patient is a 30 year old female who had a  lumbar fusion of L5-S1 done about a year ago who has been having difficulty  voiding for about 24 hours, and the nurses could not insert the Foley  catheter.  She has been complaining of difficulty voiding since yesterday,  and voided only a small amount of urine this morning.   She has a history of diabetes and has been complaining of back pain, and has  been on OxyContin, Effexor, Toradol, and Skelaxin.  She has a past history  of stress incontinence; however, she is not doing well.   PHYSICAL EXAMINATION:  She has tenderness in the suprapubic area.   I inserted a #16 Foley catheter into the bladder and drained about 700 mL of  clear urine.   IMPRESSION:  Neurogenic bladder.   PLAN:  Leave Foley indwelling for now, urine culture, Bactrim DS one b.i.d.  Follow up in three days for Foley removal for voiding trial.                                               Lindaann Slough, M.D.    MN/MEDQ  D:  12/15/2002  T:  12/15/2002  Job:  [id number]   cc:   Jene Every, M.D.  [address]  [place]  [place] [address]  Fax: [phone]

## 2010-09-13 NOTE — Assessment & Plan Note (Signed)
[facility]                           GASTROENTEROLOGY OFFICE NOTE   NAME:Wendy [name], Wendy [name]                        MRN:          [id number]  DATE:12/10/2005                            DOB:          12-Aug-1980    REFERRING PHYSICIAN:  Corwin Levins, MD   REASON FOR REFERRAL:  Dr. Jonny Ruiz asked me to evaluate Ms. Wendy [name] in  consultation regarding abdominal pain, metal taste in her mouth.   HISTORY OF PRESENT ILLNESS:  Ms. [name] is a very pleasant 30 year old who  has been on high-dose NSAIDs for approximately 6 years who noticed rather  acute onset pain beginning 1 week ago.  She describes a gnawing pain in her  epigastric area.  It is worse when she eats.  Some positions seem to make it  better.  She has also had a lot of nausea.  The pain has been there  constantly for the past week.  It may be getting a little bit worse over the  past couple of days.  She has also had a metal taste in her mouth that is  very annoying, as well.  She saw primary care physician a couple of days  ago.  They recommended she stop her Motrin.  Lab tests were done showing a  hemoglobin of 13.9, normal MCV, normal white count.  Her complete metabolic  profile was essentially normal, as well.   REVIEW OF SYSTEMS:  Notable for 65-pound weight loss in the past 12 months;  this is intentional.  She is dieting and working out aggressively.  The rest  of her review of systems is essentially negative, specifically she has had  no melena, no hematochezia, no hematemesis.  The rest of her review of  systems is essentially normal and is available on her nursing intake sheet.   PAST MEDICAL HISTORY:  Obesity, diabetes, 4 back surgeries for which she  takes narcotics and NSAIDs daily.   CURRENT MEDICINES:  Insulin pump, birth control pills, Motrin 800 mg t.i.d.  (stopped 3 days ago), Cymbalta, Vicodin 5/500, Neurontin, Lamictal,  trazodone, Ativan, Chantix, vitamin B.   ALLERGIES:   TORADOL, ALTACE, BEXTRA, AND ROBAXIN.   SOCIAL HISTORY:  Married with 1 son.  Nonsmoker, nondrinker.   FAMILY HISTORY:  She is adopted.   PHYSICAL EXAMINATION:  VITAL SIGNS:  5 feet 1 inch, 161 pounds, blood  pressure 110/66, pulse 84.  CONSTITUTIONAL:  Generally, well-appearing.  NEUROLOGIC:  Alert and oriented x3.  EYES:  Extraocular movements intact.  MOUTH:  Oropharynx moist.  No lesions.  NECK:  Supple.  No lymphadenopathy.  CARDIOVASCULAR:  Heart regular rate and rhythm.  LUNGS:  Clear to auscultation bilaterally.  ABDOMEN:  Mild to moderately tender in her upper epigastrium.  No peritoneal  signs.  Normal bowel sounds.  EXTREMITIES:  No lower extremity edema.  SKIN:  No rashes or lesions on the lower extremities.   ASSESSMENT AND PLAN:  30-year-old with 1 week of abdominal pain,  question NSAID-related ulcer.   She takes 800 mg of Motrin 3 times a day for many years.  This  is certainly  a setup for NSAID-related ulcer.  She stopped 3 days ago.  I have given her  a prescription and samples for Protonix, which she will start taking on a  twice daily basis now, and I have arranged for her to have an EGD tomorrow  morning to hopefully confirm that she does have an ulcer.  She had no signs  of gastrointestinal bleeding, clinically, and her recent CBC was normal.                                   Rachael Fee, MD   DPJ/MedQ  DD:  12/10/2005  DT:  12/10/2005  Job #:  [id number]   cc:   Corwin Levins, MD

## 2010-09-13 NOTE — Consult Note (Signed)
[facility]  Patient:    Wendy [name], Wendy [name]                      MRN: [id number] Proc. Date: 08/11/00 Adm. Date:  [id number] Attending:  Pierce Crane                          Consultation Report  REASON FOR CONSULTATION:  Seventeen-week intrauterine pregnancy with second trimester bleeding, hospitalized for persistent low back pain.  HISTORY OF PRESENT ILLNESS:  Patient is a 30 year old white female, G1, P0, EDD of January 20, 2001, at 17-0/7 weeks with worsening low back pain, previous history of lumbar surgery, and second trimester bleeding, who presents for hospitalization for followup of her chronic back pain.  PAST MEDICAL HISTORY:  Her past medical history is remarkable for insulin-dependent diabetes, on insulin pump now for approximately four years. She has no other problems other than her back pain.  Her pregnancy care has been reportedly uncomplicated with a normal triple screen, normal ultrasound and she has had a short hospitalization this past weekend for unexplained second trimester bleeding, for which time she had an ultrasound and which was reportedly normal.  MEDICATIONS:  Her medications include Percocet, prenatal vitamins, an insulin and Medrol Dosepak.  ALLERGIES:  She has allergies to ROBAXIN.  SOCIAL HISTORY:  Her social history is noncontributory.  FAMILY HISTORY:  Her family history is noncontributory as well.  PHYSICAL EXAMINATION:  GENERAL:  On physical exam, she is a well-developed, well-nourished white female in no apparent distress.  HEENT:  Normal.  LUNGS:  Clear.  HEART:  Regular rate and rhythm.  ABDOMEN:  Abdomen soft, nontender, gravid to 17 weeks.  PELVIC:   Exam is deferred.  EXTREMITIES:  Extremities reveal no cords.  NEUROLOGIC:  Exam is nonfocal.  IMPRESSION: 1. Seventeen-week intrauterine pregnancy with unexplained second trimester    bleeding. 2. Insulin-dependent diabetes with  recently poor control due to institution of    steroid Dosepak. 3. Low back pain with previous history of lumbar back surgery.  PLAN: 1. Plan is to recommend MRI for diagnosis of her current back pain to aid in    diagnosis. 2. Recommend tight glucose control with fastings less than 92, ______    less than 120. 3. Recommend checking fetal heart tones with Doptone every day. 4. Order obstetric ultrasound with specific attention to transvaginal-cervical    length and placental localization. 5. Patient will need to follow up with her obstetric Tahra Hitzeman one week post    discharge. 6. We will continue to follow while in the hospital. DD:  08/11/00 TD:  08/11/00 Job: [id number]

## 2010-09-13 NOTE — Op Note (Signed)
[facility]  Patient:    Wendy [name], Wendy [name]                      MRN: [id number] Proc. Date: 08/19/00 Adm. Date:  [id number] Attending:  Pierce Crane                           Operative Report  PREOPERATIVE DIAGNOSIS:  Recurrent herniated nucleus pulposus, L5-S1, left and foraminal stenosis.  POSTOPERATIVE DIAGNOSIS:  Recurrent herniated nucleus pulposus, L5-S1, left, foraminal stenosis, and synovial cyst.  PROCEDURES PERFORMED:  Medial microdiskectomy, L5-S1, left, foraminotomy of L5, removal of synovial cyst, foraminotomy of S1.  SURGEON:  Javier Docker, M.D.  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  The patient had been bed rest, narcotic dependent in her second trimester of pregnancy.  It was therefore decided to proceed with decompression.  Due to her lack of progress and neurologic deficit and the optimal window of her second trimester, so we felt that in her third trimester, only to become a more difficult operative candidate.  She was showing no signs of improvement.  Risks and benefits were discussed including bleeding, infection, damage to vascular structures, CSF leakage, epidural fibrosis, need for fusion in the future.  This is her second recurrence and discussed a fusion, but not during the period of time when she was pregnant.  I will discuss the problems associated with anesthesia during pregnancy.  We have preoperative consultation by Ob/Gyn, a medical physician, as well as preoperative anesthesia to minimize risks associated with the surgery.  DESCRIPTION OF PROCEDURE:  With the patient in the supine position and after the induction of adequate general anesthesia, 1 g of Kefzol IV for ________ prophylaxis, the patient was placed gently prone on the [place] frame.  All bony prominences were well-padded.  The abdomen and pelvis was free at  all times throughout the transfer.  Fetal heart sounds were noted prior to the turning and during the procedure were periodically monitored.  The lumbar region was prepped and draped in the usual sterile fashion.  Her previous surgical scar was incised.  The subcutaneous tissue was dissected and electrocautery was utilized to achieve hemostasis.  The dorsal lumbar fascia was identified and divided along the skin incision.  The paraspinous muscles were elevated from lamina of L5 and S1.  Significant scar tissue was seen in the interlaminar space on the left.  The sacrum was identified.  Previous laminotomy and foraminotomies were also identified and it was clear that this was the L5-S1 disk space and the interlaminar space.  Care was utilized to detach the fibrosis from the inferior lamina of 5 and from S1.  The S1 foramen was identified and found to be mildly stenotic and a 2 mm Kerrison was utilized to perform a foraminotomy.  Next, a high speed bur was utilized to augment the hemilaminotomy on the caudad edge of 5 and a posteromedial hemifacetectomy.  Care was utilized to mobilize fibrosis from the lateral recess.  The 5 foramen was found to be fairly stenotic and there was a fairly small synovial cyst noted from the facet, invaginated into the lateral recess, potentially contributing to compression.  This was then excised and a 2 mm Kerrison was utilized to perform an L5 foraminotomy.  There was significant compression again noted upon the L5 nerve root.  The pedicle of  S1 was then identified and the thecal sac was gently mobilized medially.  Following both foraminotomies, there was better mobilization of the thecal sac.  The disk space was identified, entered with a Penfield-4, followed by an Epstein to mobilize the disk.  A small focal disk herniation was noted here as well and disk material was removed with a pituitary.  It was felt more impressive though was the combination of  the synovial cyst, the epidural fibrosis, foraminal stenosis secondary to disk degeneration, and neuroforaminal narrowing, and facet hypertrophy.  Following the decompression, the thecal sac and roots mobilized at least a centimeter medially.  There was no residual compression noted and a hockey stick probe placed _________ of 5 and S1 found to be widely patent.  The region was copiously irrigated.  _________ electrocautery was utilized to achieve hemostasis.  Thrombin soaked Gelfoam was placed in the laminotomy defect.  Inspection revealed no evidence of CSF leakage or active bleed, and the operating microscope had been draped and brought into the surgical field.  Next, the [facility] retractor was removed.  The paraspinous muscles were inspected and no evidence of active bleed.  The dorsal lumbar fascia was reapproximated with #1 Vicryl interrupted figure-of-eight sutures.  The subcutaneous tissue reapproximated with 2-0 Vicryl simple sutures.  The skin was reapproximated with staples and the wound dressed sterilely.  A sterile dressing applied.  The patient placed supine on the hospital bed, extubated without difficulty, and transported to the recovery room in stable condition.  The patient had fetal heart tones noted following transfer out of the hospital bed.  The patient tolerated the procedure well with no complications. DD:  08/19/00 TD:  08/20/00 Job: [id number]

## 2010-09-13 NOTE — Op Note (Signed)
[facility]  Patient:    SHUNA, TABOR Visit Number: [id number] MRN: [id number]          Service Type: SUR Location: [facility]M 2550 10 Attending Physician:  Pierce Crane Dictated by:   Javier Docker, M.D. Proc. Date: 06/09/01 Admit Date:  06/09/2001                             Operative Report  PREOPERATIVE DIAGNOSIS:  Recurrent disk herniation and degenerative disk disease at L5-S1.  POSTOPERATIVE DIAGNOSIS:  Recurrent disk herniation and degenerative disk disease at L5-S1.  PROCEDURE PERFORMED:  Redo diskectomy at L5-S1, posterior lumbar interbody fusion utilizing Brantigan cages at L5-S1, pedicle screw instrumentation utilizing two AcroMed pedicle screws, lateral mass fusion utilizing local cancellous and Allomatrix bone graft.  ANESTHESIA:  General.  SURGEON:  Javier Docker, M.D.  ASSISTANT:  Patricia Nettle, M.D.  BRIEF HISTORY AND INDICATIONS:  This is a 30 year old with refractory recurrent disk herniation.  Has had three disk herniations with a fourth. _________ is indicated for diskectomy and fusion due to the unstable disk. She had persistent L5 radiculopathy, epidural fibrosis, recurrent disk.  Also, progressive disk degeneration.  Operative intervention is indicated for stabilization and fusion of the L5-S1, decompressing the L5 and S1 nerve roots.  Risks and benefits were discussed including bleeding, infection, damage to vascular structures, CSF leakage, epidural fibrosis, change in segment disease, nonunion, etc.  DESCRIPTION OF PROCEDURE:  With the patient in the supine after the induction of adequate general anesthesia, 1 g of Kefzol and 500 of vancomycin, the patient was placed on the foam rolls, all bony prominences well-padded, and meticulously inspected and ________.  The previous lumbar region was prepped and draped in the usual sterile fashion.  Previous surgical scar was incised. An incision was made from the  spinous process of 4 to the 2.  The subcutaneous tissue was dissected.  Electrocautery was utilized to achieve hemostasis.  The dorsolumbar fascia was identified and divided in line with the skin incision. The paraspinous muscle was elevated from the laminas of 4, 5, and the sacrum. A self-retaining retractor was placed.  A portion of the spinous process of 5 was removed with the Leksell rongeur.  The ligamentum flavum was detached from the caudad edge of 5 and cephalad edge of S1 with the straight curet. Epidural fibrosis was mobilized from the 5 and S1 lamina.  It was then detached from the lateral recess, gently mobilized medially.  Foraminotomy was performed in the subtrochlea to ligamentum flavum, and the epidural fibrosis invaginated into the L5 foramen, compressing the 5 root.  There is also hypertrophic facet here.  Partial medial hemifacectomies were performed bilaterally.  Foraminotomies at S1 was performed as well.  The pedicle of 5 and S1 were identified.  The pedicle of S1 was identified and just off the pedicle was the disk space. Small disk herniation was noted here which was removed.  The thecal sac and nerve roots were meticulously mobilized medially as far possible, this maneuvering the thecal sac off the annulus.  The disk space was entered and a copious portion of disk material was removed.  This further mobilized the sac. A spreader was then introduced into the disk space and confirmed under x-ray, and then redefined one disk and received ________, anterior and posterior 8 mm.  Attention was turned towards the contralateral side, the neural elements well-protected, the annulotomy was performed.  Copious portions of liquid material was removed from the disk space.  The scrapper was then scraped after 9 mm and 8 mm spreaders were utilized.  Pituitary utilized to perform a thorough diskectomy.  Measured to a 21.9 Brantigan cages.  These were packed with cancellous bone,  staying from the facets and the spinous process as well as Allomatrix bone graft was mixed appropriately.  This was then impacted in the disk space and neural elements well-protected under x-ray visualization which was noted to be satisfactory, and counter sunk posteriorly on the left to help to enter from a far lateral position, converging medially due to suboptimal mobilization of the thecal sac to remove previous scar tissue. This was inspected thoroughly and was in the disk space.  After disk distraction, a hockey stick probe was placed into the foramen of 5 and S1, and found to be widely patent.  The ala was identified as was the transverse process of 5.  A high speed bur was utilized to decorticate the pars bilaterally, the ala, and the transverse process.  Next, pedicle screws were inserted, and the 5 intersecting the transverse process, 5 in the superior articulating portions of the facet.  The high speed bur was utilized to mark a starting hole which is confirmed on x-ray to be satisfactory with conversion, and the pedicle finder, and appropriate angle. This was then inserted without difficulty bilaterally at 5 and at S1 utilizing a ball-tip probe to feel within the pedicle, and it was felt to be uncompromised at all planes.  They were subsequently capped with a 0.625, both on the 5 and S1 pedicles.  The hockey stick probe placed medially to feel the medial wall of the pedicle on the superior and inferior wall at all times. The 5 screws were then placed in the four pedicles.  The 0.625 screws were placed in the 5 pedicles and the S1 pedicles.  Excellent purchase under C-arm augmentation in the AP and lateral plane are found to be satisfactory.  A short wire was utilized to connect them.  The lock system was then utilized to lock them after compression by a rod compressor and torquing to the appropriate poundage.  This was found in the AP and lateral, and appeared to be  satisfactory.  A hockey stick probe was placed in the foramen of 5 and S1, and found to be widely patent.  The wound was copiously irrigated.  Bipolar  electrocautery was utilized to achieve strenuous hemostasis.  Next, a portion of tightened bone graft was then mixed with Allomatrix bone putty, and placed out into the lateral mass between the ala and the transverse process and the pars.  Next, fentanyl was placed on either side of the nerve roots, a cubic centimeter on either side, and allowed to set for a few minutes, then evacuated and irrigated as was the disk space with no back bleeding.  Thrombin soaked Gelfoam was placed in the laminotomy defect.  Next, retractors were removed.  The paraspinous muscles were inspected and found to be free of active bleeding.  A Hemovac was placed through a lateral stab wound in the fascia.  The muscle was closed in layers and the dorsolumbar fascia reapproximated with 1 Vicryl interrupted figure-of-eight sutures for a watertight closure.  The subcutaneous tissue with reapproximated with 2-0 Vicryl subcuticular, and the skin was reapproximated with a 3-0 subcuticular Prolene at the request of the patient, and reinforced with Steri-Strips.  A sterile dressing was applied.  The patient  was placed supine on the hospital bed, extubated without difficulty, and transported to the recovery room in satisfactory condition.  The patient tolerated the procedure well and with no complication.  Estimated blood loss was 250 cc. Dictated by:   Javier Docker, M.D. Attending Physician:  Pierce Crane DD:  06/09/01 TD:  06/10/01 Job: [id number]

## 2011-04-13 DIAGNOSIS — B351 Tinea unguium: Secondary | ICD-10-CM | POA: Insufficient documentation

## 2011-12-10 DIAGNOSIS — M0230 Reiter's disease, unspecified site: Secondary | ICD-10-CM | POA: Insufficient documentation

## 2011-12-10 DIAGNOSIS — R768 Other specified abnormal immunological findings in serum: Secondary | ICD-10-CM | POA: Insufficient documentation

## 2012-04-29 DIAGNOSIS — G894 Chronic pain syndrome: Secondary | ICD-10-CM | POA: Insufficient documentation

## 2012-04-29 DIAGNOSIS — M5417 Radiculopathy, lumbosacral region: Secondary | ICD-10-CM | POA: Insufficient documentation

## 2012-04-29 DIAGNOSIS — M961 Postlaminectomy syndrome, not elsewhere classified: Secondary | ICD-10-CM | POA: Insufficient documentation

## 2014-12-11 DIAGNOSIS — E1043 Type 1 diabetes mellitus with diabetic autonomic (poly)neuropathy: Secondary | ICD-10-CM | POA: Insufficient documentation

## 2014-12-11 DIAGNOSIS — K3184 Gastroparesis: Secondary | ICD-10-CM

## 2015-01-19 DIAGNOSIS — E669 Obesity, unspecified: Secondary | ICD-10-CM | POA: Insufficient documentation

## 2015-01-19 DIAGNOSIS — Z794 Long term (current) use of insulin: Secondary | ICD-10-CM | POA: Insufficient documentation

## 2015-04-11 DIAGNOSIS — Z9889 Other specified postprocedural states: Secondary | ICD-10-CM | POA: Insufficient documentation

## 2015-09-05 DIAGNOSIS — M24561 Contracture, right knee: Secondary | ICD-10-CM | POA: Insufficient documentation

## 2015-09-05 DIAGNOSIS — M25861 Other specified joint disorders, right knee: Secondary | ICD-10-CM | POA: Insufficient documentation

## 2016-02-27 DIAGNOSIS — G8929 Other chronic pain: Secondary | ICD-10-CM | POA: Insufficient documentation

## 2016-02-27 DIAGNOSIS — R102 Pelvic and perineal pain: Secondary | ICD-10-CM

## 2016-10-21 DIAGNOSIS — K219 Gastro-esophageal reflux disease without esophagitis: Secondary | ICD-10-CM | POA: Insufficient documentation

## 2016-10-23 DIAGNOSIS — E43 Unspecified severe protein-calorie malnutrition: Secondary | ICD-10-CM | POA: Insufficient documentation

## 2016-11-21 DIAGNOSIS — K580 Irritable bowel syndrome with diarrhea: Secondary | ICD-10-CM | POA: Insufficient documentation

## 2016-12-18 DIAGNOSIS — F310 Bipolar disorder, current episode hypomanic: Secondary | ICD-10-CM | POA: Insufficient documentation

## 2016-12-18 DIAGNOSIS — F4310 Post-traumatic stress disorder, unspecified: Secondary | ICD-10-CM | POA: Insufficient documentation

## 2016-12-18 DIAGNOSIS — F419 Anxiety disorder, unspecified: Secondary | ICD-10-CM | POA: Insufficient documentation

## 2017-06-25 DIAGNOSIS — E1041 Type 1 diabetes mellitus with diabetic mononeuropathy: Secondary | ICD-10-CM | POA: Insufficient documentation

## 2017-06-25 DIAGNOSIS — B353 Tinea pedis: Secondary | ICD-10-CM | POA: Insufficient documentation

## 2017-10-16 DIAGNOSIS — L608 Other nail disorders: Secondary | ICD-10-CM | POA: Insufficient documentation

## 2018-01-25 ENCOUNTER — Ambulatory Visit (INDEPENDENT_AMBULATORY_CARE_PROVIDER_SITE_OTHER): Payer: BLUE CROSS/BLUE SHIELD | Admitting: Physician Assistant

## 2018-01-25 ENCOUNTER — Encounter: Payer: Self-pay | Admitting: Physician Assistant

## 2018-01-25 ENCOUNTER — Other Ambulatory Visit: Payer: Self-pay

## 2018-01-25 VITALS — BP 123/84 | HR 72 | Ht 61.0 in | Wt 191.0 lb

## 2018-01-25 DIAGNOSIS — Z79890 Hormone replacement therapy (postmenopausal): Secondary | ICD-10-CM

## 2018-01-25 DIAGNOSIS — H6993 Unspecified Eustachian tube disorder, bilateral: Secondary | ICD-10-CM

## 2018-01-25 DIAGNOSIS — G43909 Migraine, unspecified, not intractable, without status migrainosus: Secondary | ICD-10-CM | POA: Insufficient documentation

## 2018-01-25 DIAGNOSIS — J309 Allergic rhinitis, unspecified: Secondary | ICD-10-CM

## 2018-01-25 DIAGNOSIS — Z1231 Encounter for screening mammogram for malignant neoplasm of breast: Secondary | ICD-10-CM

## 2018-01-25 DIAGNOSIS — E8940 Asymptomatic postprocedural ovarian failure: Secondary | ICD-10-CM | POA: Diagnosis not present

## 2018-01-25 DIAGNOSIS — F17200 Nicotine dependence, unspecified, uncomplicated: Secondary | ICD-10-CM | POA: Insufficient documentation

## 2018-01-25 DIAGNOSIS — D3501 Benign neoplasm of right adrenal gland: Secondary | ICD-10-CM | POA: Insufficient documentation

## 2018-01-25 DIAGNOSIS — Z7689 Persons encountering health services in other specified circumstances: Secondary | ICD-10-CM | POA: Diagnosis not present

## 2018-01-25 DIAGNOSIS — R062 Wheezing: Secondary | ICD-10-CM

## 2018-01-25 DIAGNOSIS — J45909 Unspecified asthma, uncomplicated: Secondary | ICD-10-CM | POA: Insufficient documentation

## 2018-01-25 MED ORDER — FLUTICASONE PROPIONATE 50 MCG/ACT NA SUSP: 1.0000 | Freq: Every day | NASAL | 3 refills | Status: AC

## 2018-01-25 MED ORDER — CETIRIZINE HCL 10 MG PO TABS: 10.0000 mg | ORAL_TABLET | Freq: Every day | ORAL | 3 refills | Status: AC

## 2018-01-25 NOTE — Progress Notes (Signed)
HPI:                                                                Wendy [name] is a 37 y.o. female who presents to [place]: [facility] today to establish care  Current concerns: sore throat  She has had a sore throat for approximately 1 month. She was recently treated for Strep Pharyngitis with PCN on 01/19/18. Throat culture was positive for beta-hemolytic colonies, no GAS. Endorses rhinorrhea, nasal congestion and post-nasal drip. Denies fever, malaise, dysphagia, swollen glands.  Patient Care Team: Trixie Dredge, PA-C as PCP - General (Physician Assistant) Rana Snare, PA-C as Consulting Physician (Pain Medicine) Melton Alar, PA-C as Consulting Physician (Endocrinology) Quintin Alto, MD as Consulting Physician (Rheumatology)  Depression screen [facility] 2/9 01/25/2018  Decreased Interest 1  Down, Depressed, Hopeless 1  PHQ - 2 Score 2  Altered sleeping 1  Tired, decreased energy 3  Change in appetite 2  Feeling bad or failure about yourself  2  Trouble concentrating 2  Moving slowly or fidgety/restless 0  Suicidal thoughts 0  PHQ-9 Score 12    GAD 7 : Generalized Anxiety Score 01/25/2018  Nervous, Anxious, on Edge 3  Control/stop worrying 3  Worry too much - different things 3  Trouble relaxing 3  Restless 2  Easily annoyed or irritable 3  Afraid - awful might happen 2  Total GAD 7 Score 19      Past Medical History:  Diagnosis Date  . Adrenal adenoma, right   . Allergy   . Anxiety   . Arthritis   . Asthma   . Bipolar 1 disorder ([place])   . COPD (chronic obstructive pulmonary disease) ([place])   . Fatty liver   . GERD (gastroesophageal reflux disease)   . Salmonella   . Type 1 diabetes mellitus ([place])    Past Surgical History:  Procedure Laterality Date  . ABDOMINAL HYSTERECTOMY     benign disease  . BACK SURGERY     multiple  . CERVICAL POLYPECTOMY    . CESAREAN SECTION    . KNEE ARTHROSCOPY Right    . SPINE SURGERY    . TONSILLECTOMY     Social History   Tobacco Use  . Smoking status: Current Every Day Smoker    Packs/day: 1.50    Types: Cigarettes  . Smokeless tobacco: Never Used  Substance Use Topics  . Alcohol use: Not Currently   family history includes Lung cancer in her other; Mental illness in her mother. She was adopted.    ROS: Review of Systems  Constitutional: Positive for diaphoresis.  HENT: Positive for congestion and sore throat.        + aural fullness  Respiratory: Positive for wheezing.   Gastrointestinal: Positive for abdominal pain (IBS) and heartburn.  Musculoskeletal: Positive for back pain and joint pain.  Neurological: Positive for weakness and headaches.  Endo/Heme/Allergies: Positive for environmental allergies.  Psychiatric/Behavioral: Positive for depression. The patient is nervous/anxious.      Medications: Current Outpatient Medications  Medication Sig Dispense Refill  . budesonide-formoterol (SYMBICORT) 160-4.5 MCG/ACT inhaler Inhale 2 puffs into the lungs 2 (two) times daily.    . cyclobenzaprine (FLEXERIL) 5 MG tablet Take 5 mg  by mouth 3 (three) times daily as needed.    . dicyclomine (BENTYL) 20 MG tablet Take 1 tablet by mouth 4 (four) times daily as needed.    Marland Kitchen estradiol (ESTRACE) 0.5 MG tablet Take 0.5 mg by mouth daily.    Marland Kitchen estradiol (ESTRACE) 1 MG tablet Take 1 mg by mouth daily.    Marland Kitchen etanercept (ENBREL SURECLICK) 50 MG/ML injection Inject 1 Dose into the skin once a week.    . folic acid (FOLVITE) 1 MG tablet Take by mouth.    Marland Kitchen glucagon (GLUCAGON EMERGENCY) 1 MG injection as needed.    Marland Kitchen glucose blood (ONETOUCH VERIO) test strip Use to check blood glucose four times daily    . glucose blood test strip Check 5 times a day    . Insulin Disposable Pump (OMNIPOD 5 PACK) MISC Inject into the skin.    Marland Kitchen insulin lispro (HUMALOG) 100 UNIT/ML injection Used via insulin pump. Dosage varies daily    . Lancets (ACCU-CHEK MULTICLIX)  lancets Check 5 times a day    . Lancets (ACCU-CHEK MULTICLIX) lancets Check 5 times a day    . linaclotide (LINZESS) 145 MCG CAPS capsule Take by mouth.    . montelukast (SINGULAIR) 10 MG tablet Take 1 tablet by mouth daily.    . Nebulizers (COMPRESSOR/NEBULIZER) MISC by Does not apply route.    . propranolol ER (INDERAL LA) 60 MG 24 hr capsule Take 60 mg by mouth daily.    . valACYclovir (VALTREX) 1000 MG tablet Take 1 tablet by mouth as needed.    . ATROVENT HFA 17 MCG/ACT inhaler     . cetirizine (ZYRTEC) 10 MG tablet Take 1 tablet (10 mg total) by mouth at bedtime. 90 tablet 3  . fluticasone (FLONASE) 50 MCG/ACT nasal spray Place 1 spray into both nostrils daily. 16 g 3  . gabapentin (NEURONTIN) 300 MG capsule Take 300 mg by mouth 3 (three) times daily.    . Insulin Disposable Pump (OMNIPOD 5 PACK) MISC     . lamoTRIgine (LAMICTAL) 100 MG tablet Take 1 tablet by mouth 2 (two) times daily.    . methadone (DOLOPHINE) 10 MG tablet Take 1 tablet by mouth 3 (three) times daily.    . methotrexate (RHEUMATREX) 2.5 MG tablet Take 5 tablets by mouth every 7 (seven) days.    Marland Kitchen oxyCODONE-acetaminophen (PERCOCET/ROXICET) 5-325 MG tablet Take 1 tablet by mouth every 6 (six) hours as needed.    . pantoprazole (PROTONIX) 40 MG tablet Take 1 tablet by mouth 2 (two) times daily.    . prochlorperazine (COMPAZINE) 10 MG tablet Take 10 mg by mouth as needed for nausea.    . propranolol (INDERAL) 20 MG tablet Take 20 mg by mouth 3 (three) times daily as needed for anxiety.    . trazodone (DESYREL) 300 MG tablet Take 1 tablet by mouth at bedtime as needed.     No current facility-administered medications for this visit.    Allergies  Allergen Reactions  . Iodinated Diagnostic Agents Rash  . Methocarbamol Rash       Objective:  BP 123/84   Pulse 72   Ht 5\' 1"  (1.549 m)   Wt 191 lb (86.6 kg)   BMI 36.09 kg/m  Gen:  alert, not ill-appearing, no distress, appropriate for age, obese female HEENT:  head normocephalic without obvious abnormality, conjunctiva and cornea clear, air-fluid level of bilateral TM's, no erythema or bulge, nasal mucosa edematous, oropharynx clear, uvula midline, neck supple, no cervical  adenopathy, trachea midline Pulm: Normal work of breathing, normal phonation, expiratory wheezes of the right lower lobe CV: Normal rate, regular rhythm, s1 and s2 distinct, no murmurs, clicks or rubs  Neuro: alert and oriented x 3, no tremor MSK: extremities atraumatic, normal gait and station Skin: intact, no rashes on exposed skin, no jaundice, no cyanosis Psych: well-groomed, cooperative, good eye contact, anxious mood, affect full-range, speech is articulate, and thought processes clear and goal-directed    No results found for this or any previous visit (from the past 72 hour(s)). No results found.    Assessment and Plan: 37 y.o. female with   .Diagnoses and all orders for this visit:  Encounter to establish care  Surgical menopause on hormone replacement therapy -     MM 3D SCREEN BREAST BILATERAL; Future  Breast cancer screening by mammogram -     MM 3D SCREEN BREAST BILATERAL; Future  Allergic rhinitis, unspecified seasonality, unspecified trigger -     fluticasone (FLONASE) 50 MCG/ACT nasal spray; Place 1 spray into both nostrils daily. -     cetirizine (ZYRTEC) 10 MG tablet; Take 1 tablet (10 mg total) by mouth at bedtime.  Eustachian tube disorder, bilateral -     fluticasone (FLONASE) 50 MCG/ACT nasal spray; Place 1 spray into both nostrils daily.  Tobacco use disorder  Wheezing on left side of chest on exhalation   - Personally reviewed PMH, PSH, PFH, medications, allergies, HM - Personally reviewed lab results in [place] from 09/2017-12/2017 - Age-appropriate cancer screening: no longer candidate for Pap smears, due for mammogram since she is on HRT - Influenza deferred, she will check with her rheumatologist - Tdap deferred, she will  check with her rheumatologist - Positive depression screen, she is followed by Psychiatry. She is considering switching her care to [facility]. She will let me know if she needs a referral - Spent a considerable amount of today's visit performing med reconciliation today. Patient will keep her specialist follow-up appointments. She will let me know if she decides she wants me to manage her asthma and/or GERD. She will continue to follow with Endocrinology for Type 1 Diabetes, Rheumatology for reactive arthritis, and Pain Mgmt.  HRT - s/p TAH for chronic pelvic pain - she states she continues to have vasomotor symptoms despite estradiol 1.5 mg daily - discussed option to increase to 2 mg or switch to transdermal preparation - discussed importance of annual mammogram  Patient education and anticipatory guidance given Patient agrees with treatment plan Follow-up in 3 months for medication management as needed if symptoms worsen or fail to improve  Darlyne Russian PA-C

## 2018-01-25 NOTE — Patient Instructions (Addendum)
Talk with your rheumatologist about Tdap and influenza vaccines. You can schedule a nurse visit to receive these  For your hormone replacement: We should use the lowest effective dose that still controls your symptoms. If you have taken estradiol 1.5 mg for more than 12 weeks, we can increase to 2 mg. The other option would be to change to transdermal estrogen You should have an annual mammogram while you are on hormone replacement

## 2018-01-31 ENCOUNTER — Telehealth: Payer: Self-pay

## 2018-01-31 ENCOUNTER — Ambulatory Visit (HOSPITAL_BASED_OUTPATIENT_CLINIC_OR_DEPARTMENT_OTHER)
Admission: RE | Admit: 2018-01-31 | Discharge: 2018-01-31 | Disposition: A | Discharge status: Home / Self Care | Payer: BLUE CROSS/BLUE SHIELD | Source: Ambulatory Visit | Attending: Physician Assistant | Admitting: Physician Assistant

## 2018-01-31 ENCOUNTER — Other Ambulatory Visit: Payer: Self-pay

## 2018-01-31 ENCOUNTER — Emergency Department (INDEPENDENT_AMBULATORY_CARE_PROVIDER_SITE_OTHER)
Admission: EM | Admit: 2018-01-31 | Discharge: 2018-01-31 | Disposition: A | Discharge status: Home / Self Care | Payer: BLUE CROSS/BLUE SHIELD | Source: Home / Self Care | Attending: Family Medicine | Admitting: Family Medicine

## 2018-01-31 DIAGNOSIS — M79662 Pain in left lower leg: Secondary | ICD-10-CM

## 2018-01-31 LAB — POCT CBC W AUTO DIFF (K'VILLE URGENT CARE)

## 2018-01-31 LAB — BASIC METABOLIC PANEL
BUN: 10 mg/dL (ref 7–25)
CO2: 29 mmol/L (ref 20–32)
Calcium: 9.2 mg/dL (ref 8.6–10.2)
Chloride: 100 mmol/L (ref 98–110)
Creat: 0.63 mg/dL (ref 0.50–1.10)
Glucose, Bld: 168 mg/dL — ABNORMAL HIGH (ref 65–99)
Potassium: 4.7 mmol/L (ref 3.5–5.3)
Sodium: 134 mmol/L — ABNORMAL LOW (ref 135–146)

## 2018-01-31 LAB — MAGNESIUM: Magnesium: 1.9 mg/dL (ref 1.5–2.5)

## 2018-01-31 MED ORDER — KETOROLAC TROMETHAMINE 60 MG/2ML IM SOLN
60.0000 mg | Freq: Once | INTRAMUSCULAR | Status: AC
  Administered 2018-01-31: 60 mg via INTRAMUSCULAR

## 2018-01-31 NOTE — Telephone Encounter (Signed)
Pt called. Informed us negative for DVT and that we will call with pending blood work. Verbalized understanding. Candie Chroman, RN

## 2018-01-31 NOTE — Discharge Instructions (Signed)
°  Please go to [facility], they are expecting you. Please enter through the emergency department where they will direct you to imaging. Do no sign into the emergency department.  You will be notified of the results of the ultrasound this afternoon. If no blood clot, please follow up with your family medical provider last this week and see additional information on leg cramps in this packet. If there IS a blood clot, a blood thinner medication will be called into your preferred pharmacy. Please schedule an appointment with your family medicine provider this week for medication management. Additional information of the medication will be discussed over the phone with you if it is called in.

## 2018-01-31 NOTE — ED Provider Notes (Signed)
[name]    CSN: [id number] Arrival date & time: 01/31/18  1146     History   Chief Complaint Chief Complaint  Patient presents with  . Leg Pain    HPI Wendy [name] is a 37 y.o. female.   HPI  Wendy [name] is a 37 y.o. female presenting to UC with c/o 3 days worsening Left calf pain and cramping.  Pain starts mid calf and radiates into the medial aspect of her knee and thigh.  She has taken ibuprofen, percocet, and flexeril w/o relief. Pt sees a pain clinic for chronic back pain from several back surgeries.  Denies recent surgery or travel. No hx of blood clots. Denies chest pain or SOB. No known injury.  She does have a hx of low potassium but has tried taking OTC supplements. She would like her potassium level checked today.    Past Medical History:  Diagnosis Date  . Adrenal adenoma, right   . Allergy   . Anxiety   . Arthritis   . Asthma   . Bipolar 1 disorder ([place])   . COPD (chronic obstructive pulmonary disease) ([place])   . Fatty liver   . GERD (gastroesophageal reflux disease)   . Salmonella   . Type 1 diabetes mellitus [facility])     Patient Active Problem List   Diagnosis Date Noted  . Asthma 01/25/2018  . Migraine 01/25/2018  . Surgical menopause on hormone replacement therapy 01/25/2018  . Allergic rhinitis 01/25/2018  . Eustachian tube disorder, bilateral 01/25/2018  . Tobacco use disorder 01/25/2018  . Adrenal adenoma, right   . Nail deformity 10/16/2017  . Diabetic mononeuropathy associated with type 1 diabetes mellitus ([place]) 06/25/2017  . Tinea pedis of right foot 06/25/2017  . Anxiety 12/18/2016  . Bipolar I disorder, most recent episode hypomanic ([place]) 12/18/2016  . Posttraumatic stress disorder 12/18/2016  . Irritable bowel syndrome with diarrhea 11/21/2016  . Severe protein-calorie malnutrition ([place]) 10/23/2016  . Gastroesophageal reflux disease without esophagitis 10/21/2016  . Chronic pelvic pain in female 02/27/2016  .  Contracture of right knee 09/05/2015  . Patellofemoral dysfunction of right knee 09/05/2015  . Status post arthroscopy of right knee 04/11/2015  . Insulin long-term use ([place]) 01/19/2015  . Obesity (BMI 30-39.9) 01/19/2015  . Diabetic gastroparesis associated with type 1 diabetes mellitus ([place]) 12/11/2014  . Lumbar post-laminectomy syndrome 04/29/2012  . Lumbosacral radiculopathy 04/29/2012  . Pain syndrome, chronic 04/29/2012  . 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 Reactive arthritis ([place]) 12/10/2011  . Onychomycosis 04/13/2011    Past Surgical History:  Procedure Laterality Date  . ABDOMINAL HYSTERECTOMY     benign disease  . BACK SURGERY     multiple  . CERVICAL POLYPECTOMY    . CESAREAN SECTION    . KNEE ARTHROSCOPY Right   . SPINE SURGERY    . TONSILLECTOMY      OB History    Gravida  2   Para  2   Term      Preterm      AB      Living  2     SAB      TAB      Ectopic      Multiple      Live Births               Home Medications    Prior to Admission medications   Medication Sig Start Date End Date Taking? Authorizing Provider  ATROVENT HFA 17 MCG/ACT inhaler  12/02/17  Yes [provider]  budesonide-formoterol (SYMBICORT) 160-4.5 MCG/ACT inhaler Inhale 2 puffs into the lungs 2 (two) times daily. 12/02/17  Yes [provider]  cetirizine (ZYRTEC) 10 MG tablet Take 1 tablet (10 mg total) by mouth at bedtime. 01/25/18  Yes Trixie Dredge, PA-C  cyclobenzaprine (FLEXERIL) 5 MG tablet Take 5 mg by mouth 3 (three) times daily as needed. 11/29/17 05/28/18 Yes [provider]  dicyclomine (BENTYL) 20 MG tablet Take 1 tablet by mouth 4 (four) times daily as needed. 11/20/14  Yes [provider]  estradiol (ESTRACE) 0.5 MG tablet Take 0.5 mg by mouth daily. 11/15/17  Yes [provider]  etanercept (ENBREL SURECLICK) 50 MG/ML injection Inject 1 Dose into the skin once a week. 08/25/17  Yes [provider]  fluticasone (FLONASE) 50 MCG/ACT nasal spray Place 1 spray into both nostrils daily. 01/25/18  Yes Trixie Dredge, PA-C  folic acid (FOLVITE) 1 MG tablet Take by mouth. 02/05/17  Yes [provider]  gabapentin (NEURONTIN) 300 MG capsule Take 300 mg by mouth 3 (three) times daily. 11/11/17  Yes [provider]  insulin lispro (HUMALOG) 100 UNIT/ML injection Used via insulin pump. Dosage varies daily 02/12/15  Yes [provider]  lamoTRIgine (LAMICTAL) 100 MG tablet Take 1 tablet by mouth 2 (two) times daily. 07/24/15  Yes [provider]  methadone (DOLOPHINE) 10 MG tablet Take 1 tablet by mouth 3 (three) times daily. 01/11/18 02/10/18 Yes [provider]  methotrexate (RHEUMATREX) 2.5 MG tablet Take 5 tablets by mouth every 7 (seven) days. 08/25/17  Yes [provider]  oxyCODONE-acetaminophen (PERCOCET/ROXICET) 5-325 MG tablet Take 1 tablet by mouth every 6 (six) hours as needed. 01/11/18 02/10/18 Yes [provider]  pantoprazole (PROTONIX) 40 MG tablet Take 1 tablet by mouth 2 (two) times daily. 01/18/18  Yes [provider]  prochlorperazine (COMPAZINE) 10 MG tablet Take 10 mg by mouth as needed for nausea.   Yes [provider]  propranolol (INDERAL) 20 MG tablet Take 20 mg by mouth 3 (three) times daily as needed for anxiety. 11/13/17  Yes [provider]  trazodone (DESYREL) 300 MG tablet Take 1 tablet by mouth at bedtime as needed. 12/11/15  Yes [provider]  valACYclovir (VALTREX) 1000 MG tablet Take 1 tablet by mouth as needed. 03/08/17  Yes [provider]  estradiol (ESTRACE) 1 MG tablet Take 1 mg by mouth daily. 11/15/17   [provider]  glucagon (GLUCAGON EMERGENCY) 1 MG injection as needed. 02/11/16   [provider]  glucose blood (ONETOUCH VERIO) test strip Use to check blood glucose four times daily 10/16/17   [provider]   glucose blood test strip Check 5 times a day 01/09/15   [provider]  Insulin Disposable Pump (OMNIPOD 5 PACK) MISC Inject into the skin. 11/03/17   [provider]  Insulin Disposable Pump (OMNIPOD 5 PACK) MISC  11/03/17   [provider]  Lancets (ACCU-CHEK MULTICLIX) lancets Check 5 times a day 01/09/15   [provider]  Lancets (ACCU-CHEK MULTICLIX) lancets Check 5 times a day 01/09/15   [provider]  linaclotide Rolan Lipa) 145 MCG CAPS capsule Take by mouth. 10/22/15   [provider]  montelukast (SINGULAIR) 10 MG tablet Take 1 tablet by mouth daily. 05/28/15   [provider]  Nebulizers (COMPRESSOR/NEBULIZER) MISC by Does not apply route. 05/29/16   [provider]  propranolol ER (INDERAL LA) 60 MG 24 hr capsule Take 60 mg by mouth daily. 01/18/18   [provider]    Family History Family History  Adopted: Yes  Problem Relation Age of Onset  . Mental illness Mother   . Lung cancer Other     Social History Social History   Tobacco Use  . Smoking status: Current Every Day Smoker    Packs/day: 1.50    Types: Cigarettes  . Smokeless tobacco: Never Used  Substance Use Topics  . Alcohol use: Not Currently  . Drug use: Never     Allergies   Iodinated diagnostic agents and Methocarbamol   Review of Systems Review of Systems  Musculoskeletal: Positive for myalgias. Negative for arthralgias.  Skin: Negative for color change and wound.  Neurological: Negative for weakness and numbness.     Physical Exam Triage Vital Signs ED Triage Vitals [01/31/18 1250]  Enc Vitals Group     BP 110/70     Pulse Rate 74     Resp      Temp 98.2 F (36.8 C)     Temp Source Oral     SpO2 96 %     Weight      Height      Head Circumference      Peak Flow      Pain Score      Pain Loc      Pain Edu?      Excl. in [place]?    No data found.  Updated Vital Signs BP 110/70 (BP Location: Left Arm)    Pulse 74   Temp 98.2 F (36.8 C) (Oral)   Wt 190 lb 6.4 oz (86.4 kg)   SpO2 96%   BMI 35.98 kg/m   Visual Acuity Right Eye Distance:   Left Eye Distance:   Bilateral Distance:    Right Eye Near:   Left Eye Near:    Bilateral Near:     Physical Exam  Constitutional: She is oriented to person, place, and time. She appears well-developed and well-nourished.  HENT:  Head: Normocephalic and atraumatic.  Eyes: EOM are normal.  Neck: Normal range of motion.  Cardiovascular: Normal rate.  Pulmonary/Chest: Effort normal.  Musculoskeletal: Normal range of motion. She exhibits tenderness. She exhibits no edema.  Left calf: tenderness along posterior and medial calf, palpable muscle spasm in proximal calf. Full ROM Left knee Left thigh and knee: tenderness to distal medial thigh and medial knee.  Neurological: She is alert and oriented to person, place, and time.  Skin: Skin is warm and dry. No erythema.  Left leg: skin in tact. No erythema or ecchymosis  Psychiatric: She has a normal mood and affect. Her behavior is normal.  Nursing note and vitals reviewed.    UC Treatments / Results  Labs (all labs ordered are listed, but only abnormal results are displayed) Labs Reviewed  BASIC METABOLIC PANEL  MAGNESIUM  POCT CBC W AUTO DIFF ([place])    EKG None  Radiology US Venous Img Lower Unilateral Left  Result Date: 01/31/2018 CLINICAL DATA:  Severe LEFT calf cramping and mild swelling for 3 days, no known injury, history diabetes mellitus, smoker, on home replacement therapy, COPD, asthma EXAM: LEFT LOWER EXTREMITY VENOUS DOPPLER ULTRASOUND TECHNIQUE: Gray-scale sonography with graded compression, as well as color Doppler and duplex ultrasound were performed to evaluate the lower extremity deep venous systems from the level of the common femoral vein and including the common femoral,  femoral, profunda femoral, popliteal and calf veins including the posterior tibial,  peroneal and gastrocnemius veins when visible. The superficial great saphenous vein was also interrogated. Spectral Doppler was utilized to evaluate flow at rest and with distal augmentation maneuvers in the common femoral, femoral and popliteal veins. COMPARISON:  None FINDINGS: Contralateral Common Femoral Vein: Respiratory phasicity is normal and symmetric with the symptomatic side. No evidence of thrombus. Normal compressibility. Common Femoral Vein: No evidence of thrombus. Normal compressibility, respiratory phasicity and response to augmentation. Saphenofemoral Junction: No evidence of thrombus. Normal compressibility and flow on color Doppler imaging. Profunda Femoral Vein: No evidence of thrombus. Normal compressibility and flow on color Doppler imaging. Femoral Vein: No evidence of thrombus. Normal compressibility, respiratory phasicity and response to augmentation. Popliteal Vein: No evidence of thrombus. Normal compressibility, respiratory phasicity and response to augmentation. Calf Veins: No evidence of thrombus. Normal compressibility and flow on color Doppler imaging. Superficial Great Saphenous Vein: No evidence of thrombus. Normal compressibility. Venous Reflux:  None. Other Findings:  None. IMPRESSION: No evidence of deep venous thrombosis in the LEFT lower extremity. Electronically Signed   By: Lavonia Dana M.D.   On: 01/31/2018 16:33    Procedures Procedures (including critical care time)  Medications Ordered in UC Medications  ketorolac (TORADOL) injection 60 mg (60 mg Intramuscular Given 01/31/18 1317)    Initial Impression / Assessment and Plan / UC Course  I have reviewed the triage vital signs and the nursing notes.  Pertinent labs & imaging results that were available during my care of the patient were reviewed by me and considered in my medical decision making (see chart for details).    Hx concerning for possible blood clot, vs muscle spasm (labs for BMP and magnesium  drawn)  U/S: negative for DVT or other acute findings. Pt will be notified.  Final Clinical Impressions(s) / UC Diagnoses   Final diagnoses:  Pain of left calf     Discharge Instructions      Please go to [facility], they are expecting you. Please enter through the emergency department where they will direct you to imaging. Do no sign into the emergency department.  You will be notified of the results of the ultrasound this afternoon. If no blood clot, please follow up with your family medical provider last this week and see additional information on leg cramps in this packet. If there IS a blood clot, a blood thinner medication will be called into your preferred pharmacy. Please schedule an appointment with your family medicine provider this week for medication management. Additional information of the medication will be discussed over the phone with you if it is called in.    ED Prescriptions    None     Controlled Substance Prescriptions [place] Controlled Substance Registry consulted? pt currently goes to pain management. pt to continue taking medications as prescribed. no additional medication prescrubed today.   Noe Gens, [place] 01/31/18 1644

## 2018-01-31 NOTE — ED Triage Notes (Signed)
Here with progressive left calf pain x3 dys ago. Sudden onset. Denies strain or injury. Redness noted to L calf area radiating to inner thigh. Denies chest pain or SOB.Denies Hx blood clots

## 2018-02-01 ENCOUNTER — Telehealth: Payer: Self-pay | Admitting: Emergency Medicine

## 2018-02-22 ENCOUNTER — Ambulatory Visit (INDEPENDENT_AMBULATORY_CARE_PROVIDER_SITE_OTHER): Payer: BLUE CROSS/BLUE SHIELD | Admitting: Physician Assistant

## 2018-02-22 VITALS — BP 123/78 | HR 68 | Temp 98.2°F | Wt 193.0 lb

## 2018-02-22 DIAGNOSIS — R070 Pain in throat: Secondary | ICD-10-CM | POA: Diagnosis not present

## 2018-02-22 DIAGNOSIS — E8940 Asymptomatic postprocedural ovarian failure: Secondary | ICD-10-CM

## 2018-02-22 DIAGNOSIS — R1013 Epigastric pain: Secondary | ICD-10-CM

## 2018-02-22 DIAGNOSIS — R499 Unspecified voice and resonance disorder: Secondary | ICD-10-CM

## 2018-02-22 DIAGNOSIS — Z7969 Long term (current) use of other immunomodulators and immunosuppressants: Secondary | ICD-10-CM

## 2018-02-22 DIAGNOSIS — B370 Candidal stomatitis: Secondary | ICD-10-CM | POA: Diagnosis not present

## 2018-02-22 DIAGNOSIS — Z79899 Other long term (current) drug therapy: Secondary | ICD-10-CM

## 2018-02-22 DIAGNOSIS — G8929 Other chronic pain: Secondary | ICD-10-CM

## 2018-02-22 DIAGNOSIS — R635 Abnormal weight gain: Secondary | ICD-10-CM

## 2018-02-22 DIAGNOSIS — Z79890 Hormone replacement therapy (postmenopausal): Secondary | ICD-10-CM

## 2018-02-22 DIAGNOSIS — R1312 Dysphagia, oropharyngeal phase: Secondary | ICD-10-CM

## 2018-02-22 DIAGNOSIS — L719 Rosacea, unspecified: Secondary | ICD-10-CM | POA: Diagnosis not present

## 2018-02-22 DIAGNOSIS — T451X5A Adverse effect of antineoplastic and immunosuppressive drugs, initial encounter: Secondary | ICD-10-CM

## 2018-02-22 LAB — POCT RAPID STREP A (OFFICE): Rapid Strep A Screen: NEGATIVE

## 2018-02-22 MED ORDER — FLUCONAZOLE 200 MG PO TABS: 200.0000 mg | ORAL_TABLET | Freq: Every day | ORAL | 0 refills | Status: AC

## 2018-02-22 NOTE — Progress Notes (Signed)
HPI:                                                                Wendy [name] is a 37 y.o. female who presents to [facility] today for persistent sore throat  Reports she has had a persistent sore throat for approx 3 months. Symptoms are waxing and waning. She occasionally has fevers of 100-100.5 in the evenings and malaise/fatigue. Endorses trouble swallowing pills and solids. Her voice has become more hoarse. She currently smokes 1.5 ppd. She states she has horrible reflux and takes Protonix. Has never had an EGD. She has had multiple throat cultures showing beta-hemolytic colonies/NGAS. She has been treated with multiple rounds of antibiotics without improvement.  C/o facial rash present for 5 years. Has been told this is rosacea. She has tried one topical medication (Oxymetazoline cream) without improvement.  She also reports abnormal weight gain of over 50 pounds since her hysterectomy 06/11/16. Hysterectomy was performed for chronic pelvic pain. Weight on admission at that time was 186 lb, weight today is 193 lb, representing only a 7 pound weight gain. She is currently on HRT and is wondering if her hormones can be adjusted.   Past Medical History:  Diagnosis Date  . Adrenal adenoma, right   . Allergy   . Anxiety   . Arthritis   . Asthma   . Bipolar 1 disorder ([place])   . COPD (chronic obstructive pulmonary disease) ([place])   . Fatty liver   . GERD (gastroesophageal reflux disease)   . Salmonella   . Type 1 diabetes mellitus ([place])    Past Surgical History:  Procedure Laterality Date  . ABDOMINAL HYSTERECTOMY     benign disease  . BACK SURGERY     multiple  . CERVICAL POLYPECTOMY    . CESAREAN SECTION    . KNEE ARTHROSCOPY Right   . SPINE SURGERY    . TONSILLECTOMY     Social History   Tobacco Use  . Smoking status: Current Every Day Smoker    Packs/day: 1.50    Types: Cigarettes  . Smokeless tobacco: Never Used   Substance Use Topics  . Alcohol use: Not Currently   family history includes Lung cancer in her other; Mental illness in her mother. She was adopted.    ROS: negative except as noted in the HPI  Medications: Current Outpatient Medications  Medication Sig Dispense Refill  . ATROVENT HFA 17 MCG/ACT inhaler     . budesonide-formoterol (SYMBICORT) 160-4.5 MCG/ACT inhaler Inhale 2 puffs into the lungs 2 (two) times daily.    . cetirizine (ZYRTEC) 10 MG tablet Take 1 tablet (10 mg total) by mouth at bedtime. 90 tablet 3  . cyclobenzaprine (FLEXERIL) 5 MG tablet Take 5 mg by mouth 3 (three) times daily as needed.    . dicyclomine (BENTYL) 20 MG tablet Take 1 tablet by mouth 4 (four) times daily as needed.    Marland Kitchen estradiol (ESTRACE) 0.5 MG tablet Take 0.5 mg by mouth daily.    Marland Kitchen estradiol (ESTRACE) 1 MG tablet Take 1 mg by mouth daily.    Marland Kitchen etanercept (ENBREL SURECLICK) 50 MG/ML injection Inject 1 Dose into the skin once a week.    . fluconazole (DIFLUCAN)  200 MG tablet Take 1 tablet (200 mg total) by mouth daily for 7 days. 7 tablet 0  . fluticasone (FLONASE) 50 MCG/ACT nasal spray Place 1 spray into both nostrils daily. 16 g 3  . folic acid (FOLVITE) 1 MG tablet Take by mouth.    . gabapentin (NEURONTIN) 300 MG capsule Take 300 mg by mouth 3 (three) times daily.    Marland Kitchen glucagon (GLUCAGON EMERGENCY) 1 MG injection as needed.    Marland Kitchen glucose blood (ONETOUCH VERIO) test strip Use to check blood glucose four times daily    . glucose blood test strip Check 5 times a day    . Insulin Disposable Pump (OMNIPOD 5 PACK) MISC Inject into the skin.    . Insulin Disposable Pump (OMNIPOD 5 PACK) MISC     . insulin lispro (HUMALOG) 100 UNIT/ML injection Used via insulin pump. Dosage varies daily    . lamoTRIgine (LAMICTAL) 100 MG tablet Take 1 tablet by mouth 2 (two) times daily.    . Lancets (ACCU-CHEK MULTICLIX) lancets Check 5 times a day    . Lancets (ACCU-CHEK MULTICLIX) lancets Check 5 times a day    .  linaclotide (LINZESS) 145 MCG CAPS capsule Take by mouth.    . methotrexate (RHEUMATREX) 2.5 MG tablet Take 5 tablets by mouth every 7 (seven) days.    . metroNIDAZOLE (METROGEL) 0.75 % gel Apply 1 application topically 2 (two) times daily. 45 g 3  . montelukast (SINGULAIR) 10 MG tablet Take 1 tablet by mouth daily.    . Nebulizers (COMPRESSOR/NEBULIZER) MISC by Does not apply route.    . pantoprazole (PROTONIX) 40 MG tablet Take 1 tablet by mouth 2 (two) times daily.    . prochlorperazine (COMPAZINE) 10 MG tablet Take 10 mg by mouth as needed for nausea.    . propranolol (INDERAL) 20 MG tablet Take 20 mg by mouth 3 (three) times daily as needed for anxiety.    . propranolol ER (INDERAL LA) 60 MG 24 hr capsule Take 60 mg by mouth daily.    . trazodone (DESYREL) 300 MG tablet Take 1 tablet by mouth at bedtime as needed.    . valACYclovir (VALTREX) 1000 MG tablet Take 1 tablet by mouth as needed.     No current facility-administered medications for this visit.    Allergies  Allergen Reactions  . Iodinated Diagnostic Agents Rash  . Methocarbamol Rash       Objective:  BP 123/78   Pulse 68   Temp 98.2 F (36.8 C) (Oral)   Wt 193 lb (87.5 kg)   BMI 36.47 kg/m  Gen:  alert, not ill-appearing, no distress, appropriate for age, obese female HEENT: head normocephalic without obvious abnormality, conjunctiva and cornea clear, oropharynx clear, tonsils absent, no exudates, uvula midline, neck supple no cervical adenopathy, trachea midline Pulm: Normal work of breathing, voice is hoarse, clear to auscultation bilaterally, no wheezes, rales or rhonchi CV: Normal rate, regular rhythm, s1 and s2 distinct, no murmurs, clicks or rubs  Neuro: alert and oriented x 3, no tremor MSK: extremities atraumatic, normal gait and station Skin: intact, no rashes on exposed skin, no jaundice, no cyanosis      Assessment and Plan: 37 y.o. female with   .Alanie was seen today for sore throat.  Diagnoses  and all orders for this visit:  Chronic throat pain -     POCT rapid strep A -     fluconazole (DIFLUCAN) 200 MG tablet; Take 1 tablet (200 mg  total) by mouth daily for 7 days. -     Ambulatory referral to ENT  Oropharyngeal candidiasis  Surgical menopause on hormone replacement therapy -     Ambulatory referral to Obstetrics / Gynecology  Rosacea -     metroNIDAZOLE (METROGEL) 0.75 % gel; Apply 1 application topically 2 (two) times daily.  Oropharyngeal dysphagia -     Ambulatory referral to Gastroenterology -     Ambulatory referral to ENT  Dyspepsia -     Ambulatory referral to Gastroenterology  Change in voice -     Ambulatory referral to ENT  Abnormal weight gain  Immunosuppressed due to chemotherapy   Chronic throat pain for over 3 months. Throat culture positive for NGAS without improvement on multiple antibiotics. I think this culture represents colonization. In the setting of immunosuppression due to methotrexate and type 1 diabetes as well as use of an inhaled corticosteroid, esophgeal candidiasis is on my differential. Will start empiric treatment with Diflucan Given that she is also experiencing dyspepsia, epigastric pain, dysphagia and voice change w/tobacco use I am also referring her to GI and ENT for possible EGD and flexible laryngoscopy, especially if pain does not improve with antifungal treatment    Patient education and anticipatory guidance given Patient agrees with treatment plan Follow-up in 2 weeks or sooner as needed if symptoms worsen or fail to improve  Darlyne Russian PA-C

## 2018-02-22 NOTE — Patient Instructions (Addendum)
Contact Dr. Cindee Salt [phone] to set up an appointment for your difficulty swallowing / reflux symptoms I will also place a referral and send him my office notes  I will contact you regarding rosacea treatment. I think we should hold off on oral antibiotics until we have your possible yeast infection under control, but we will definitely start topical therapy!

## 2018-02-23 ENCOUNTER — Encounter: Payer: Self-pay | Admitting: Physician Assistant

## 2018-02-23 DIAGNOSIS — R635 Abnormal weight gain: Secondary | ICD-10-CM | POA: Insufficient documentation

## 2018-02-23 DIAGNOSIS — L719 Rosacea, unspecified: Secondary | ICD-10-CM | POA: Insufficient documentation

## 2018-02-23 DIAGNOSIS — T451X5A Adverse effect of antineoplastic and immunosuppressive drugs, initial encounter: Secondary | ICD-10-CM | POA: Insufficient documentation

## 2018-02-23 DIAGNOSIS — R1013 Epigastric pain: Secondary | ICD-10-CM | POA: Insufficient documentation

## 2018-02-23 DIAGNOSIS — R499 Unspecified voice and resonance disorder: Secondary | ICD-10-CM | POA: Insufficient documentation

## 2018-02-23 DIAGNOSIS — G8929 Other chronic pain: Secondary | ICD-10-CM | POA: Insufficient documentation

## 2018-02-23 DIAGNOSIS — R1312 Dysphagia, oropharyngeal phase: Secondary | ICD-10-CM | POA: Insufficient documentation

## 2018-02-23 DIAGNOSIS — Z79899 Other long term (current) drug therapy: Secondary | ICD-10-CM | POA: Insufficient documentation

## 2018-02-23 DIAGNOSIS — R070 Pain in throat: Principal | ICD-10-CM

## 2018-02-23 MED ORDER — METRONIDAZOLE 0.75 % EX GEL: 1.0000 "application " | Freq: Two times a day (BID) | CUTANEOUS | 3 refills | Status: AC

## 2018-03-22 ENCOUNTER — Encounter: Payer: BLUE CROSS/BLUE SHIELD | Admitting: Family Medicine

## 2018-03-22 ENCOUNTER — Encounter: Payer: Self-pay | Admitting: Family Medicine

## 2018-03-22 DIAGNOSIS — N959 Unspecified menopausal and perimenopausal disorder: Secondary | ICD-10-CM

## 2018-03-22 NOTE — Progress Notes (Signed)
Patient did not keep appointment today. She may call to reschedule.  

## 2018-04-01 ENCOUNTER — Encounter: Payer: Self-pay | Admitting: Physician Assistant

## 2018-04-26 ENCOUNTER — Ambulatory Visit: Payer: BLUE CROSS/BLUE SHIELD | Admitting: Physician Assistant

## 2018-08-19 ENCOUNTER — Other Ambulatory Visit: Payer: Self-pay | Admitting: Physician Assistant

## 2018-08-19 LAB — COMPLETE METABOLIC PANEL WITH GFR
ALT: 11 (ref 3–30)
AST: 11
Alkaline Phosphatase: 71
BUN: 12
Creatine, Serum: 0.62
POTASSIUM: 4.1
SODIUM: 133 — AB
Total Bilirubin: 0.3 (ref 0.1–1.4)

## 2018-08-19 LAB — LIPID PANEL W/REFLEX DIRECT LDL
CHOLESTEROL, TOTAL: 200
Direct LDL: 138
HDL: 49
TRIGLYCERIDES: 175 — AB (ref 40–160)

## 2018-08-19 LAB — MICROALBUMIN / CREATININE URINE RATIO

## 2018-08-19 LAB — HEMOGLOBIN A1C: Hemoglobin A1C: 10.1 % — AB (ref 4.0–5.6)

## 2019-12-08 ENCOUNTER — Ambulatory Visit: Payer: Self-pay

## 2020-05-18 ENCOUNTER — Emergency Department (INDEPENDENT_AMBULATORY_CARE_PROVIDER_SITE_OTHER)
Admission: EM | Admit: 2020-05-18 | Discharge: 2020-05-18 | Disposition: A | Discharge status: Home / Self Care | Payer: BC Managed Care – PPO | Source: Home / Self Care | Attending: Family Medicine | Admitting: Family Medicine

## 2020-05-18 ENCOUNTER — Other Ambulatory Visit: Payer: Self-pay

## 2020-05-18 DIAGNOSIS — R053 Chronic cough: Secondary | ICD-10-CM

## 2020-05-18 MED ORDER — FLUCONAZOLE 150 MG PO TABS: 150.0000 mg | ORAL_TABLET | Freq: Once | ORAL | 0 refills | Status: AC

## 2020-05-18 MED ORDER — PROMETHAZINE-DM 6.25-15 MG/5ML PO SYRP: ORAL_SOLUTION | ORAL | 0 refills | Status: AC

## 2020-05-18 MED ORDER — PREDNISONE 20 MG PO TABS: ORAL_TABLET | ORAL | 0 refills | Status: AC

## 2020-05-18 MED ORDER — DOXYCYCLINE HYCLATE 100 MG PO CAPS: ORAL_CAPSULE | ORAL | 0 refills | Status: AC

## 2020-05-18 NOTE — ED Provider Notes (Signed)
[name]    CSN: [id number] Arrival date & time: 05/18/20  1843      History   Chief Complaint Chief Complaint  Patient presents with  . Fever  . Cough    HPI Wendy [name] is a 40 y.o. female.   Patient developed URI symptoms about 2 weeks ago with initial nasal congestion, fatigue, cough, myalgias, and nausea/vomiting/diarrhea.  Her cough and fever have persisted, and she now has developed anterior chest tightness and right ear pressure.  She has asthma/COPD and a past history of several episodes of pneumonia.  She has a distant past history of frequent otitis media and T-tubes.  The history is provided by the patient.    Past Medical History:  Diagnosis Date  . Adrenal adenoma, right   . Allergy   . Anxiety   . Arthritis   . Asthma   . Bipolar 1 disorder ([place])   . COPD (chronic obstructive pulmonary disease) ([place])   . Fatty liver   . GERD (gastroesophageal reflux disease)   . Salmonella   . Type 1 diabetes mellitus [facility])     Patient Active Problem List   Diagnosis Date Noted  . Abnormal weight gain 02/23/2018  . Change in voice 02/23/2018  . Dyspepsia 02/23/2018  . Oropharyngeal dysphagia 02/23/2018  . Rosacea 02/23/2018  . Chronic throat pain 02/23/2018  . Immunosuppressed due to chemotherapy ([place]) 02/23/2018  . Asthma 01/25/2018  . Migraine 01/25/2018  . Surgical menopause on hormone replacement therapy 01/25/2018  . Allergic rhinitis 01/25/2018  . Eustachian tube disorder, bilateral 01/25/2018  . Tobacco use disorder 01/25/2018  . Adrenal adenoma, right   . Nail deformity 10/16/2017  . Diabetic mononeuropathy associated with type 1 diabetes mellitus ([place]) 06/25/2017  . Tinea pedis of right foot 06/25/2017  . Anxiety 12/18/2016  . Bipolar I disorder, most recent episode hypomanic ([place]) 12/18/2016  . Posttraumatic stress disorder 12/18/2016  . Irritable bowel syndrome with diarrhea 11/21/2016  . Severe protein-calorie malnutrition  ([place]) 10/23/2016  . Gastroesophageal reflux disease without esophagitis 10/21/2016  . Chronic pelvic pain in female 02/27/2016  . Contracture of right knee 09/05/2015  . Patellofemoral dysfunction of right knee 09/05/2015  . Status post arthroscopy of right knee 04/11/2015  . Insulin long-term use ([place]) 01/19/2015  . Obesity (BMI 30-39.9) 01/19/2015  . Diabetic gastroparesis associated with type 1 diabetes mellitus ([place]) 12/11/2014  . Lumbar post-laminectomy syndrome 04/29/2012  . Lumbosacral radiculopathy 04/29/2012  . Pain syndrome, chronic 04/29/2012  . 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 Onychomycosis 04/13/2011    Past Surgical History:  Procedure Laterality Date  . ABDOMINAL HYSTERECTOMY     benign disease  . BACK SURGERY     multiple  . CERVICAL POLYPECTOMY    . CESAREAN SECTION    . KNEE ARTHROSCOPY Right   . SPINE SURGERY    . TONSILLECTOMY      OB History    Gravida  2   Para  2   Term      Preterm      AB      Living  2     SAB      IAB      Ectopic      Multiple      Live Births               Home Medications    Prior to Admission medications   Medication  Sig Start Date End Date Taking? Authorizing Provider  doxycycline (VIBRAMYCIN) 100 MG capsule Take one cap PO Q12hr with food. 05/18/20  Yes Kandra Nicolas, MD  fluconazole (DIFLUCAN) 150 MG tablet Take 1 tablet (150 mg total) by mouth once for 1 dose. May repeat in 72 hours. 05/18/20 05/18/20 Yes Kandra Nicolas, MD  predniSONE (DELTASONE) 20 MG tablet Take one tab by mouth twice daily for 4 days, then one daily for 3 days. Take with food. 05/18/20  Yes Kandra Nicolas, MD  promethazine-dextromethorphan (PROMETHAZINE-DM) 6.25-15 MG/5ML syrup Take 57mL HS PRN cough 05/18/20  Yes Kandra Nicolas, MD  ATROVENT HFA 17 MCG/ACT inhaler  12/02/17   [provider]  budesonide-formoterol (SYMBICORT) 160-4.5 MCG/ACT inhaler Inhale 2 puffs  into the lungs 2 (two) times daily. 12/02/17   [provider]  cetirizine (ZYRTEC) 10 MG tablet Take 1 tablet (10 mg total) by mouth at bedtime. 01/25/18   Trixie Dredge, PA-C  dicyclomine (BENTYL) 20 MG tablet Take 1 tablet by mouth 4 (four) times daily as needed. 11/20/14   [provider]  estradiol (ESTRACE) 0.5 MG tablet Take 0.5 mg by mouth daily. 11/15/17   [provider]  estradiol (ESTRACE) 1 MG tablet Take 1 mg by mouth daily. 11/15/17   [provider]  etanercept (ENBREL SURECLICK) 50 MG/ML injection Inject 1 Dose into the skin once a week. 08/25/17   [provider]  fluticasone (FLONASE) 50 MCG/ACT nasal spray Place 1 spray into both nostrils daily. 01/25/18   Trixie Dredge, PA-C  folic acid (FOLVITE) 1 MG tablet Take by mouth. 02/05/17   [provider]  gabapentin (NEURONTIN) 300 MG capsule Take 300 mg by mouth 3 (three) times daily. 11/11/17   [provider]  glucagon (GLUCAGON EMERGENCY) 1 MG injection as needed. 02/11/16   [provider]  glucose blood (ONETOUCH VERIO) test strip Use to check blood glucose four times daily 10/16/17   [provider]  glucose blood test strip Check 5 times a day 01/09/15   [provider]  Insulin Disposable Pump (OMNIPOD 5 PACK) MISC Inject into the skin. 11/03/17   [provider]  Insulin Disposable Pump (OMNIPOD 5 PACK) MISC  11/03/17   [provider]  insulin lispro (HUMALOG) 100 UNIT/ML injection Used via insulin pump. Dosage varies daily 02/12/15   [provider]  lamoTRIgine (LAMICTAL) 100 MG tablet Take 1 tablet by mouth 2 (two) times daily. 07/24/15   [provider]  Lancets (ACCU-CHEK MULTICLIX) lancets Check 5 times a day 01/09/15   [provider]  Lancets (ACCU-CHEK MULTICLIX) lancets Check 5 times a day 01/09/15   [provider]  linaclotide Rolan Lipa) 145 MCG CAPS capsule  Take by mouth. 10/22/15   [provider]  methotrexate (RHEUMATREX) 2.5 MG tablet Take 5 tablets by mouth every 7 (seven) days. 08/25/17   [provider]  metroNIDAZOLE (METROGEL) 0.75 % gel Apply 1 application topically 2 (two) times daily. 02/23/18   Trixie Dredge, PA-C  montelukast (SINGULAIR) 10 MG tablet Take 1 tablet by mouth daily. 05/28/15   [provider]  Nebulizers (COMPRESSOR/NEBULIZER) MISC by Does not apply route. 05/29/16   [provider]  pantoprazole (PROTONIX) 40 MG tablet Take 1 tablet by mouth 2 (two) times daily. 01/18/18   [provider]  prochlorperazine (COMPAZINE) 10 MG tablet Take 10 mg by mouth as needed for nausea.    [provider]  propranolol (  INDERAL) 20 MG tablet Take 20 mg by mouth 3 (three) times daily as needed for anxiety. 11/13/17   [provider]  propranolol ER (INDERAL LA) 60 MG 24 hr capsule Take 60 mg by mouth daily. 01/18/18   [provider]  trazodone (DESYREL) 300 MG tablet Take 1 tablet by mouth at bedtime as needed. 12/11/15   [provider]  valACYclovir (VALTREX) 1000 MG tablet Take 1 tablet by mouth as needed. 03/08/17   [provider]    Family History Family History  Adopted: Yes  Problem Relation Age of Onset  . Mental illness Mother   . Lung cancer Other     Social History Social History   Tobacco Use  . Smoking status: Current Every Day Smoker    Packs/day: 0.50    Types: Cigarettes  . Smokeless tobacco: Never Used  Vaping Use  . Vaping Use: Never used  Substance Use Topics  . Alcohol use: Not Currently  . Drug use: Never     Allergies   Iodinated diagnostic agents and Methocarbamol   Review of Systems Review of Systems  No sore throat + cough No pleuritic pain, but feels tight in anterior chest + wheezing + nasal congestion + post-nasal drainage No sinus pain/pressure No itchy/red eyes + earache No  hemoptysis + SOB + fever, + chills No nausea No vomiting No abdominal pain No diarrhea No urinary symptoms No skin rash + fatigue + myalgias No headache     Physical Exam Triage Vital Signs ED Triage Vitals  Enc Vitals Group     BP 05/18/20 1903 104/73     Pulse Rate 05/18/20 1903 91     Resp 05/18/20 1903 16     Temp 05/18/20 1903 98.9 F (37.2 C)     Temp Source 05/18/20 1903 Oral     SpO2 05/18/20 1903 97 %     Weight --      Height --      Head Circumference --      Peak Flow --      Pain Score 05/18/20 1901 0     Pain Loc --      Pain Edu? --      Excl. in [place]? --    No data found.  Updated Vital Signs BP 104/73 (BP Location: Left Arm)   Pulse 91   Temp 98.9 F (37.2 C) (Oral)   Resp 16   SpO2 97%   Visual Acuity Right Eye Distance:   Left Eye Distance:   Bilateral Distance:    Right Eye Near:   Left Eye Near:    Bilateral Near:     Physical Exam Nursing notes and Vital Signs reviewed. Appearance:  Patient appears stated age, and in no acute distress Eyes:  Pupils are equal, round, and reactive to light and accomodation.  Extraocular movement is intact.  Conjunctivae are not inflamed  Ears:  Canals normal.  Tympanic membranes are scarred bilaterally with decreased landmarks.  Serous effusions present bilaterally. Nose:  Congested turbinates.  No sinus tenderness.    Pharynx:  Normal Neck:  Supple.  Mildly enlarged lateral nodes are present, tender to palpation on the left.   Lungs:   Bilateral wheezes present.  Breath sounds are equal.  Moving air well. Chest:  Distinct tenderness to palpation over the mid-sternum.  Heart:  Regular rate and rhythm without murmurs, rubs, or gallops.  Abdomen:  Nontender without masses or hepatosplenomegaly.  Bowel sounds are present.  No CVA or flank tenderness.  Extremities:  No edema.  Skin:  No rash present.   UC Treatments / Results  Labs (all labs ordered are listed, but only abnormal results are  displayed) Labs Reviewed  NOVEL CORONAVIRUS, NAA    EKG   Radiology No results found.  Procedures Procedures (including critical care time)  Medications Ordered in UC Medications - No data to display  Initial Impression / Assessment and Plan / UC Course  I have reviewed the triage vital signs and the nursing notes.  Pertinent labs & imaging results that were available during my care of the patient were reviewed by me and considered in my medical decision making (see chart for details).    Begin doxycycline 100mg  BID.  Rx for Diflucan at patient's request. COVID19 PCR pending. Given Rx for prednisone burst/taper (patient prefers to wait and add later if needed). Rx for promethazine DM syrup at bedtime at patient's request. Followup with Family Doctor if not improved in one week.   Final Clinical Impressions(s) / UC Diagnoses   Final diagnoses:  Persistent cough     Discharge Instructions     Take plain guaifenesin (1200mg  extended release tabs such as Mucinex) twice daily, with plenty of water, for cough and congestion.  May add Pseudoephedrine (30mg , one or two every 4 to 6 hours) for sinus congestion.  Get adequate rest.   May use Afrin nasal spray (or generic oxymetazoline) each morning for about 5 days and then discontinue.  Also recommend using saline nasal spray several times daily and saline nasal irrigation (AYR is a common brand).  Use Flonase nasal spray each morning after using Afrin nasal spray and saline nasal irrigation. Try warm salt water gargles for sore throat.  Stop all antihistamines for now, and other non-prescription cough/cold preparations. Continue all inhalers as prescribed. Begin prednisone if not improving about 4 to 5 days     ED Prescriptions    Medication Sig Dispense Auth. Provider   doxycycline (VIBRAMYCIN) 100 MG capsule Take one cap PO Q12hr with food. 20 capsule Kandra Nicolas, MD   fluconazole (DIFLUCAN) 150 MG tablet Take 1 tablet  (150 mg total) by mouth once for 1 dose. May repeat in 72 hours. 2 tablet Kandra Nicolas, MD   promethazine-dextromethorphan (PROMETHAZINE-DM) 6.25-15 MG/5ML syrup Take 72mL HS PRN cough 50 mL Kandra Nicolas, MD   predniSONE (DELTASONE) 20 MG tablet Take one tab by mouth twice daily for 4 days, then one daily for 3 days. Take with food. 11 tablet Kandra Nicolas, MD        Kandra Nicolas, MD 05/19/20 [phone]

## 2020-05-18 NOTE — ED Triage Notes (Signed)
Patient presents to Urgent Care with complaints of intermittent cough, fever, sore throat, right ear pain, and fatigue since about 18 days ago. Patient reports she is susceptible to pneumonia and is concerned that's what she has.

## 2020-05-18 NOTE — Discharge Instructions (Addendum)
Take plain guaifenesin (1200mg  extended release tabs such as Mucinex) twice daily, with plenty of water, for cough and congestion.  May add Pseudoephedrine (30mg , one or two every 4 to 6 hours) for sinus congestion.  Get adequate rest.   May use Afrin nasal spray (or generic oxymetazoline) each morning for about 5 days and then discontinue.  Also recommend using saline nasal spray several times daily and saline nasal irrigation (AYR is a common brand).  Use Flonase nasal spray each morning after using Afrin nasal spray and saline nasal irrigation. Try warm salt water gargles for sore throat.  Stop all antihistamines for now, and other non-prescription cough/cold preparations. Continue all inhalers as prescribed. Begin prednisone if not improving about 4 to 5 days

## 2020-05-20 LAB — NOVEL CORONAVIRUS, NAA: SARS-CoV-2, NAA: NOT DETECTED

## 2020-05-20 LAB — SARS-COV-2, NAA 2 DAY TAT

## 2023-06-27 DEATH — deceased
